# Patient Record
Sex: Male | Born: 1965 | Hispanic: Refuse to answer | Smoking: Former smoker
Health system: Southern US, Community
[De-identification: ages and names within clinical notes are randomized; demographics above are authoritative.]

## PROBLEM LIST (undated history)

## (undated) DIAGNOSIS — I10 Essential (primary) hypertension: Secondary | ICD-10-CM

## (undated) HISTORY — DX: Essential (primary) hypertension: I10

## (undated) HISTORY — PX: KNEE SURGERY: SHX244

---

## 1999-11-24 ENCOUNTER — Emergency Department (HOSPITAL_COMMUNITY): Admission: EM | Admit: 1999-11-24 | Discharge: 1999-11-24 | Payer: Self-pay | Admitting: Emergency Medicine

## 1999-11-24 ENCOUNTER — Encounter: Payer: Self-pay | Admitting: Emergency Medicine

## 2000-03-10 ENCOUNTER — Emergency Department (HOSPITAL_COMMUNITY): Admission: EM | Admit: 2000-03-10 | Discharge: 2000-03-10 | Payer: Self-pay | Admitting: Emergency Medicine

## 2000-05-12 ENCOUNTER — Encounter: Payer: Self-pay | Admitting: Emergency Medicine

## 2000-05-12 ENCOUNTER — Emergency Department (HOSPITAL_COMMUNITY): Admission: EM | Admit: 2000-05-12 | Discharge: 2000-05-12 | Payer: Self-pay | Admitting: Emergency Medicine

## 2003-05-16 ENCOUNTER — Emergency Department (HOSPITAL_COMMUNITY): Admission: EM | Admit: 2003-05-16 | Discharge: 2003-05-16 | Payer: Self-pay | Admitting: Emergency Medicine

## 2003-06-19 ENCOUNTER — Emergency Department (HOSPITAL_COMMUNITY): Admission: EM | Admit: 2003-06-19 | Discharge: 2003-06-19 | Payer: Self-pay

## 2005-07-29 ENCOUNTER — Encounter: Admission: RE | Admit: 2005-07-29 | Discharge: 2005-07-29 | Payer: Self-pay | Admitting: Occupational Medicine

## 2009-08-06 ENCOUNTER — Emergency Department (HOSPITAL_COMMUNITY): Admission: EM | Admit: 2009-08-06 | Discharge: 2009-08-06 | Payer: Self-pay | Admitting: Emergency Medicine

## 2009-08-27 ENCOUNTER — Ambulatory Visit (HOSPITAL_COMMUNITY): Admission: RE | Admit: 2009-08-27 | Discharge: 2009-08-27 | Payer: Self-pay | Admitting: Orthopedic Surgery

## 2009-09-13 ENCOUNTER — Ambulatory Visit (HOSPITAL_COMMUNITY): Admission: RE | Admit: 2009-09-13 | Discharge: 2009-09-14 | Payer: Self-pay | Admitting: Orthopedic Surgery

## 2009-11-04 ENCOUNTER — Encounter: Admission: RE | Admit: 2009-11-04 | Discharge: 2010-02-02 | Payer: Self-pay | Admitting: Orthopedic Surgery

## 2010-07-06 ENCOUNTER — Encounter: Payer: Self-pay | Admitting: Orthopedic Surgery

## 2010-09-08 LAB — APTT: aPTT: 28 seconds (ref 24–37)

## 2010-09-08 LAB — COMPREHENSIVE METABOLIC PANEL
ALT: 15 U/L (ref 0–53)
BUN: 11 mg/dL (ref 6–23)
CO2: 22 mEq/L (ref 19–32)
Chloride: 104 mEq/L (ref 96–112)
GFR calc Af Amer: >60 mL/min (ref >60)
Glucose, Bld: 90 mg/dL (ref 70–99)

## 2010-09-08 LAB — URINALYSIS, ROUTINE W REFLEX MICROSCOPIC
Bilirubin Urine: NEGATIVE
Glucose, UA: NEGATIVE mg/dL
Ketones, ur: NEGATIVE mg/dL
Specific Gravity, Urine: 1.008 (ref 1.005–1.030)
Urobilinogen, UA: 0.2 mg/dL (ref 0.0–1.0)

## 2010-09-08 LAB — CBC
Hemoglobin: 15 g/dL (ref 13.0–17.0)
Platelets: 211 10*3/uL (ref 150–400)

## 2011-11-08 IMAGING — CR DG KNEE COMPLETE 4+V*R*
4 series · 4 of 4 positions shown · non-contrast
Comparison: Femur 08/06/2009

CLINICAL DATA: Pain in the lateral side of knee.  Pain with
weightbearing.  Twisting injury.

RIGHT KNEE - COMPLETE 4+ VIEW

[t knee ap right]
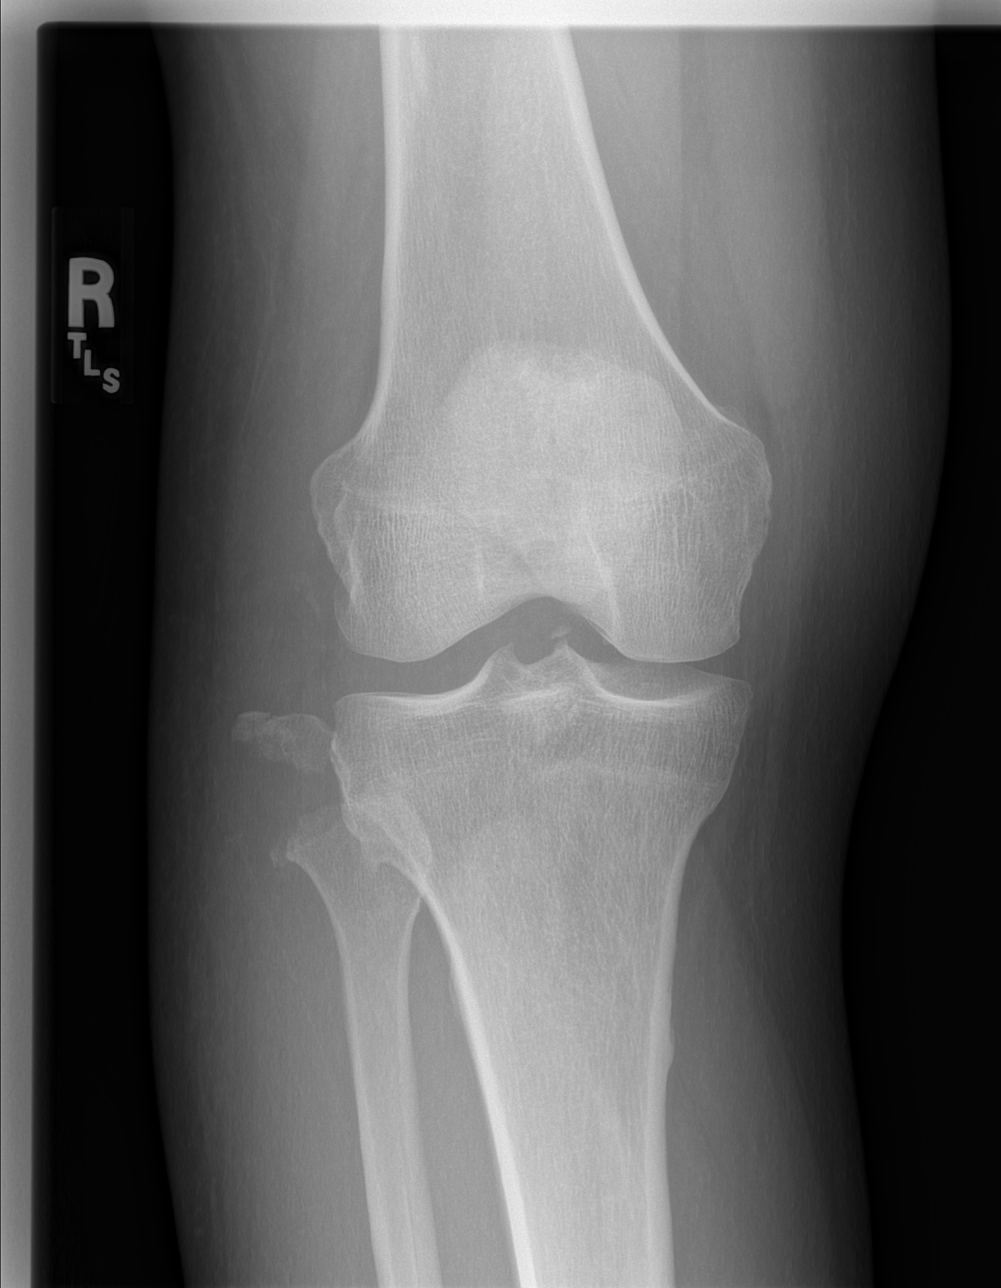

[t knee oblique right (1 of 2)]
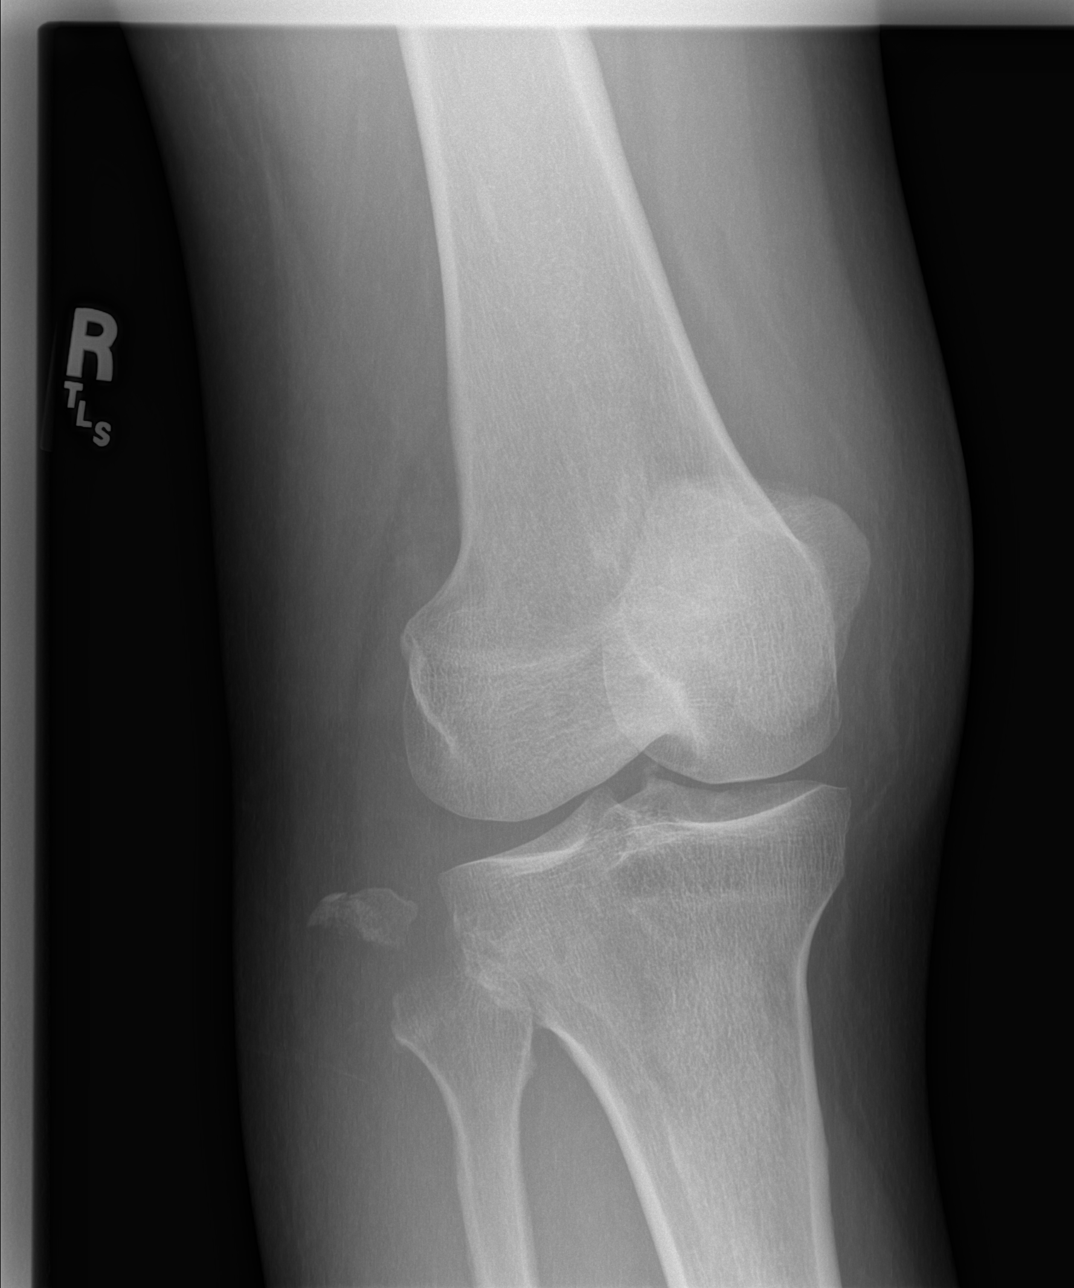

[t knee oblique right (2 of 2)]
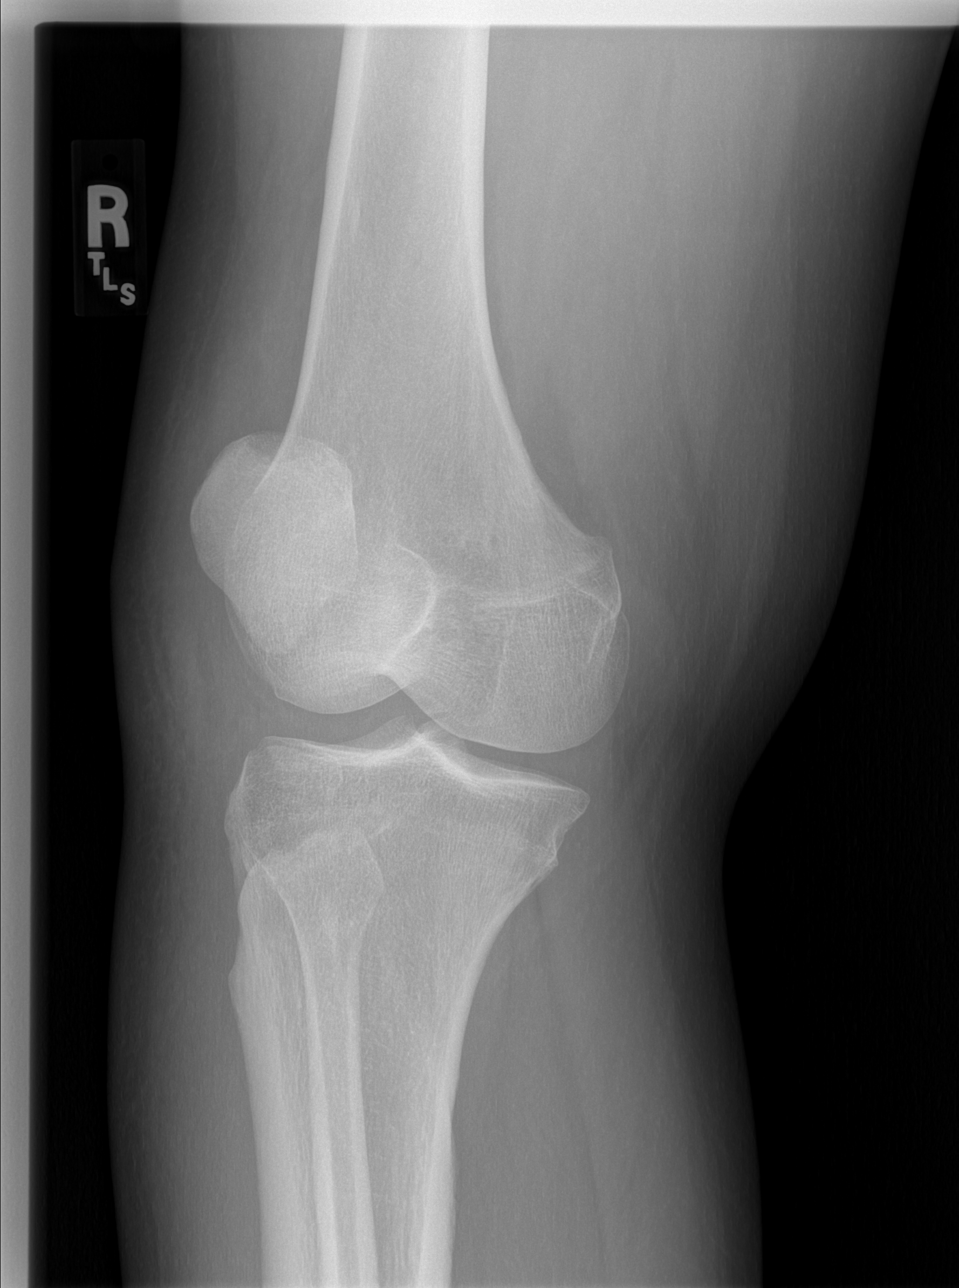

[t knee lat right]
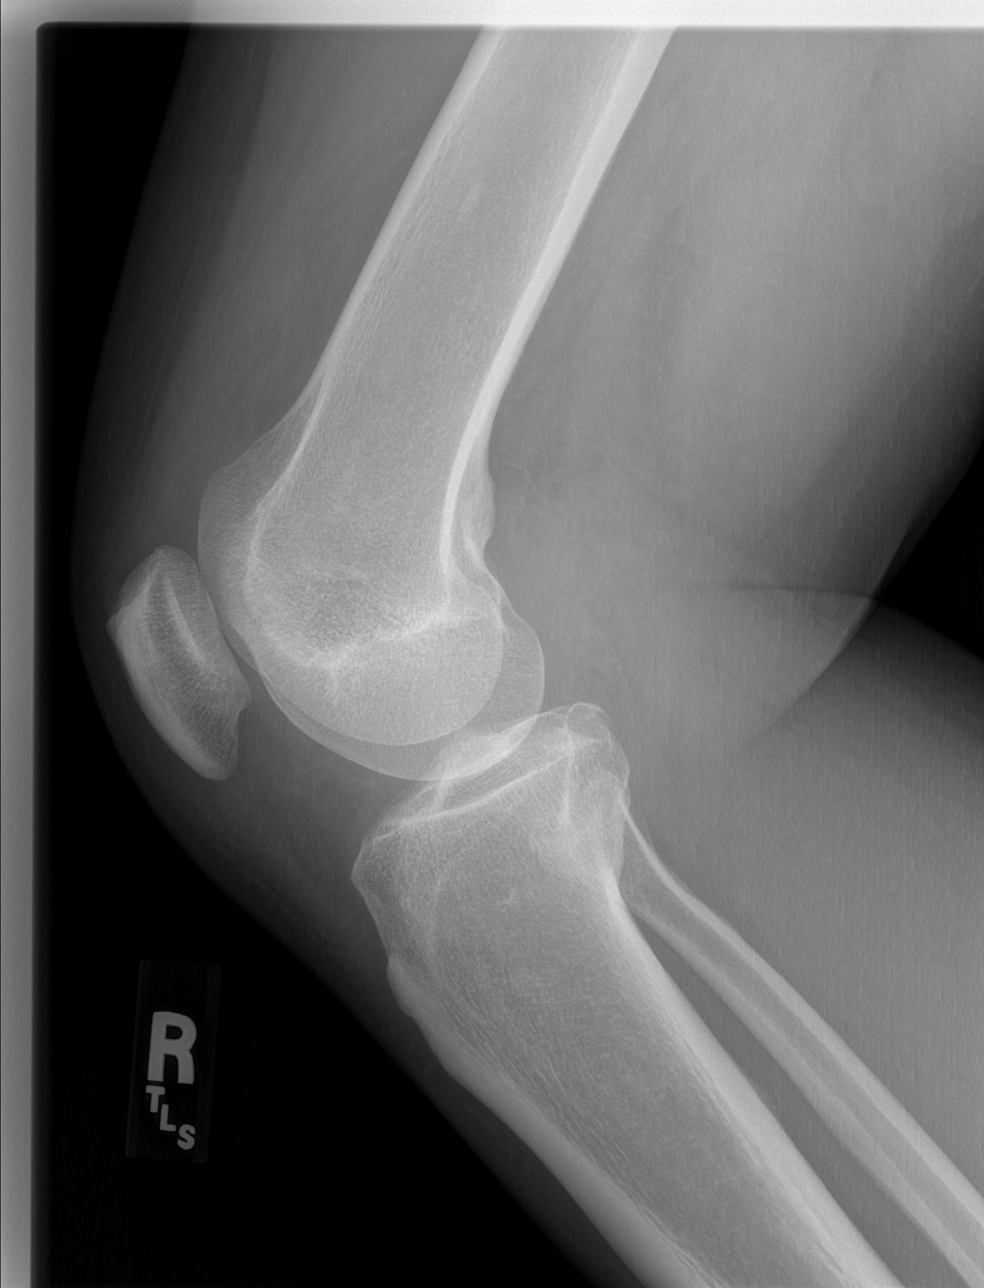

[4 of 4 positions shown; findings below may reference images not displayed]

FINDINGS: There is a joint effusion.  A bone fragment is seen
lateral to the lateral tibial plateau, consistent with a
significantly displaced fibular head fracture.  There is a small
bone density adjacent to the tibial spines, raising concern for
possible cruciate injury.  No definite tibial plateau fracture
identified.
IMPRESSION: 1.  Fibular head fracture.
2.  Concern for possible cruciate injury.  Consider MRI.

## 2016-02-17 ENCOUNTER — Encounter (HOSPITAL_COMMUNITY): Payer: Self-pay | Admitting: Emergency Medicine

## 2016-02-17 ENCOUNTER — Emergency Department (HOSPITAL_COMMUNITY)
Admission: EM | Admit: 2016-02-17 | Discharge: 2016-02-17 | Disposition: A | Discharge status: Home / Self Care | Payer: Self-pay | Attending: Emergency Medicine | Admitting: Emergency Medicine

## 2016-02-17 ENCOUNTER — Emergency Department (HOSPITAL_COMMUNITY): Payer: Self-pay

## 2016-02-17 DIAGNOSIS — Y999 Unspecified external cause status: Secondary | ICD-10-CM | POA: Insufficient documentation

## 2016-02-17 DIAGNOSIS — Y939 Activity, unspecified: Secondary | ICD-10-CM | POA: Insufficient documentation

## 2016-02-17 DIAGNOSIS — Y929 Unspecified place or not applicable: Secondary | ICD-10-CM | POA: Insufficient documentation

## 2016-02-17 DIAGNOSIS — M25561 Pain in right knee: Secondary | ICD-10-CM

## 2016-02-17 DIAGNOSIS — F17200 Nicotine dependence, unspecified, uncomplicated: Secondary | ICD-10-CM | POA: Insufficient documentation

## 2016-02-17 DIAGNOSIS — W01198A Fall on same level from slipping, tripping and stumbling with subsequent striking against other object, initial encounter: Secondary | ICD-10-CM | POA: Insufficient documentation

## 2016-02-17 MED ORDER — OXYCODONE-ACETAMINOPHEN 5-325 MG PO TABS: 1.0000 | ORAL_TABLET | Freq: Four times a day (QID) | ORAL | 0 refills | Status: DC | PRN

## 2016-02-17 MED ORDER — NAPROXEN 500 MG PO TABS: 500.0000 mg | ORAL_TABLET | Freq: Two times a day (BID) | ORAL | 0 refills | Status: DC

## 2016-02-17 MED ORDER — OXYCODONE-ACETAMINOPHEN 5-325 MG PO TABS
1.0000 | ORAL_TABLET | Freq: Once | ORAL | Status: AC
  Administered 2016-02-17: 1 via ORAL
  Filled 2016-02-17: qty 1

## 2016-02-17 NOTE — ED Triage Notes (Signed)
Pt sts right knee pain after falling 4 days ago and still having pain

## 2016-02-17 NOTE — Discharge Instructions (Signed)
Take medication as prescribed. [facility] as soon as possible. Keep you leg elevated when resting at home.

## 2016-02-17 NOTE — ED Provider Notes (Signed)
[facility]EM[facility] DEPT Provider Note   CSN: [id number] Arrival date & time: 02/17/16  1130    By signing my name below, I, Sonum Patel, attest that this documentation has been prepared under the direction and in the presence of Jayzen Paver, [place]. Electronically Signed: Sonum Patel, Neurosurgeon. 02/17/16. 12:12 PM.  History   Chief Complaint Chief Complaint  Patient presents with  . Knee Pain    The history is provided by the patient. No language interpreter was used.     HPI Comments: Jeffrey Walters is a 50 y.o. male who presents to the Emergency Department complaining of constant right knee pain that began 3 days ago after a slip and fall, striking it on the ground. Patient states he awoke this morning with worsened pain and swelling to the affected knee. He states bearing weight worsens his pain and causes his right knee to buckle. He rates his pain as a 10/10 currently. He has applied ice and heat without relief. He denies numbness, weakness.   History reviewed. No pertinent past medical history.  There are no active problems to display for this patient.   History reviewed. No pertinent surgical history.     Home Medications    Prior to Admission medications   Not on File    Family History History reviewed. No pertinent family history.  Social History Social History  Substance Use Topics  . Smoking status: Current Every Day Smoker  . Smokeless tobacco: Never Used  . Alcohol use Yes     Allergies   Review of patient's allergies indicates no known allergies.   Review of Systems Review of Systems 10 Systems reviewed and all are negative for acute change except as noted in the HPI.    Physical Exam Updated Vital Signs BP (!) 154/110 (BP Location: Right Arm)   Pulse 105   Temp 98.9 F (37.2 C) (Oral)   Resp 18   SpO2 100%   Physical Exam  Constitutional: He is oriented to person, place, and time. He appears well-developed and well-nourished.  HENT:  Head:  Normocephalic and atraumatic.  Cardiovascular: Normal rate and intact distal pulses.   Pulmonary/Chest: Effort normal.  Musculoskeletal: Normal range of motion. He exhibits edema and tenderness. He exhibits no deformity.  Right knee diffusely edematous with point tenderness along inferior joint line. Full ROM with good strength. 2+ DP and PT pulses.   Neurological: He is alert and oriented to person, place, and time.  Skin: Skin is warm and dry.  Psychiatric: He has a normal mood and affect.  Nursing note and vitals reviewed.    ED Treatments / Results  DIAGNOSTIC STUDIES: Oxygen Saturation is 100% on RA, normal by my interpretation.    COORDINATION OF CARE: 12:12 PM Discussed treatment plan with pt at bedside and pt agreed to plan.    Labs (all labs ordered are listed, but only abnormal results are displayed) Labs Reviewed - No data to display  EKG  EKG Interpretation None       Radiology No results found.  Procedures Procedures (including critical care time)  Medications Ordered in ED Medications  oxyCODONE-acetaminophen (PERCOCET/ROXICET) 5-325 MG per tablet 1 tablet (not administered)     Initial Impression / Assessment and Plan / ED Course  I have reviewed the triage vital signs and the nursing notes.  Pertinent labs & imaging results that were available during my care of the patient were reviewed by me and considered in my medical decision making (see chart for  details).  Clinical Course   3:53 PM Ortho consult order placed 2 hrs ago, call placed ~90 minutes ago. We have re-paged ortho several times, I personally paged them twice within the past 30 minutes. Pt does not want to wait any longer. He wants to go home and call to f/u in ortho clinic. Neurovascularly intact and hemodynamically stable.  As pt was receiving knee immobilizera nd preparing for discharge I was able to get in touch with Dr. Magnus IvanBlackman of ortho who agrees to immobilize and f/u in clinic.  Pt has seen Dr. Rennis ChrisSupple in the past so instructed [facility]. rx for pain meds given.  Final Clinical Impressions(s) / ED Diagnoses   Final diagnoses:  Knee pain, right    New Prescriptions Discharge Medication List as of 02/17/2016  4:19 PM    START taking these medications   Details  naproxen (NAPROSYN) 500 MG tablet Take 1 tablet (500 mg total) by mouth 2 (two) times daily., Starting Mon 02/17/2016, Print    oxyCODONE-acetaminophen (PERCOCET/ROXICET) 5-325 MG tablet Take 1-2 tablets by mouth every 6 (six) hours as needed for severe pain., Starting Mon 02/17/2016, Print      I personally performed the services described in this documentation, which was scribed in my presence. The recorded information has been reviewed and is accurate.    Carlene CoriaSerena Y Tamela Elsayed, PA-C 02/18/16 [id number]    Lorre NickAnthony Allen, MD 02/18/16 [phone]

## 2016-02-20 ENCOUNTER — Emergency Department (HOSPITAL_COMMUNITY)
Admission: EM | Admit: 2016-02-20 | Discharge: 2016-02-21 | Disposition: A | Discharge status: Home / Self Care | Payer: Worker's Compensation | Attending: Emergency Medicine | Admitting: Emergency Medicine

## 2016-02-20 ENCOUNTER — Encounter (HOSPITAL_COMMUNITY): Payer: Self-pay | Admitting: Emergency Medicine

## 2016-02-20 DIAGNOSIS — F17200 Nicotine dependence, unspecified, uncomplicated: Secondary | ICD-10-CM | POA: Insufficient documentation

## 2016-02-20 DIAGNOSIS — Z0289 Encounter for other administrative examinations: Secondary | ICD-10-CM | POA: Insufficient documentation

## 2016-02-20 NOTE — ED Triage Notes (Signed)
Pt. reports persistent right knee pain unrelieved by prescription Percocet and Naproxen , he injured his knee at work last Friday , he was seen here 3 days ago discharged home with prescription pain medications . Ambulatory using his crutches with knee immobilizer.

## 2016-02-20 NOTE — ED Notes (Signed)
Clarification with staff that pt here for a work note based on previous visit. Acuity changed.

## 2016-02-21 MED ORDER — OXYCODONE-ACETAMINOPHEN 5-325 MG PO TABS: 1.0000 | ORAL_TABLET | Freq: Four times a day (QID) | ORAL | 0 refills | Status: DC | PRN

## 2016-02-21 NOTE — Discharge Instructions (Signed)
Continue to elevate the knee, take your medications as directed and follow up with ortho as planned. Return here for worsening symptoms.

## 2016-02-21 NOTE — ED Provider Notes (Signed)
[facility] Provider Note   CSN: [id number] Arrival date & time: 02/20/16  2021     History   Chief Complaint Chief Complaint  Patient presents with  . Knee Pain    HPI Jeffrey Walters is a 50 y.o. male who presents to the ED for a work note. He reports that he was evaluated here in the ED 3 days ago and given referral to ortho. He call to set up an appointment and they could not give him one until the Workers Comp was approved. 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 He is also out of his pain medication since he could not see the doctor.   The history is provided by the patient. No language interpreter was used.    History reviewed. No pertinent past medical history.  There are no active problems to display for this patient.   Past Surgical History:  Procedure Laterality Date  . KNEE SURGERY         Home Medications    Prior to Admission medications   Medication Sig Start Date End Date Taking? Authorizing Provider  naproxen (NAPROSYN) 500 MG tablet Take 1 tablet (500 mg total) by mouth 2 (two) times daily. 02/17/16   Carlene CoriaSerena Y Sam, PA-C  oxyCODONE-acetaminophen (PERCOCET/ROXICET) 5-325 MG tablet Take 1 tablet by mouth every 6 (six) hours as needed for severe pain. 02/21/16   Maveric Debono Orlene OchM Laurisa Sahakian, NP    Family History No family history on file.  Social History Social History  Substance Use Topics  . Smoking status: Current Every Day Smoker  . Smokeless tobacco: Never Used  . Alcohol use Yes     Allergies   Penicillins   Review of Systems Review of Systems Negative except as stated in HPI  Physical Exam Updated Vital Signs BP 124/99 (BP Location: Right Arm)   Pulse 76   Temp 98.2 F (36.8 C) (Oral)   Resp 20   SpO2 98%   Physical Exam  Constitutional: He is oriented to person, place, and time. He  appears well-developed and well-nourished.  HENT:  Head: Normocephalic.  Eyes: EOM are normal.  Neck: Neck supple.  Cardiovascular: Normal rate.   Pulmonary/Chest: Effort normal.  Abdominal: Soft. There is no tenderness.  Musculoskeletal:  Knee immobilizer in place and patient using crutches for ambulation. Pedal pulses 2+.  Neurological: He is alert and oriented to person, place, and time. No cranial nerve deficit.  Skin: Skin is warm and dry.  Psychiatric: He has a normal mood and affect. His behavior is normal.  Nursing note and vitals reviewed.    ED Treatments / Results  Labs (all labs ordered are listed, but only abnormal results are displayed) Labs Reviewed - No data to display  Radiology No results found.  Procedures Procedures (including critical care time)  Medications Ordered in ED Medications - No data to display   Initial Impression / Assessment and Plan / ED Course  I have reviewed the triage vital signs and the nursing notes.   Clinical Course   Work excuse for patient and pain medication.  Final Clinical Impressions(s) / ED Diagnoses   Final diagnoses:  Encounter to obtain excuse from work    New Prescriptions Discharge Medication List as of 02/21/2016 12:16 AM       [facility]  Orlene Och, NP 02/21/16 1610    Shaune Pollack, MD 02/21/16 1344

## 2018-08-09 ENCOUNTER — Emergency Department (HOSPITAL_COMMUNITY)
Admission: EM | Admit: 2018-08-09 | Discharge: 2018-08-10 | Disposition: A | Discharge status: Home / Self Care | Payer: Self-pay

## 2020-01-04 ENCOUNTER — Other Ambulatory Visit: Payer: Self-pay | Admitting: Family Medicine

## 2020-01-04 ENCOUNTER — Ambulatory Visit
Admission: RE | Admit: 2020-01-04 | Discharge: 2020-01-04 | Disposition: A | Discharge status: Home / Self Care | Payer: Worker's Compensation | Source: Ambulatory Visit | Attending: Family Medicine | Admitting: Family Medicine

## 2020-01-04 DIAGNOSIS — S9782XA Crushing injury of left foot, initial encounter: Secondary | ICD-10-CM

## 2021-10-08 ENCOUNTER — Encounter (HOSPITAL_COMMUNITY): Payer: Self-pay | Admitting: Emergency Medicine

## 2021-10-08 ENCOUNTER — Ambulatory Visit (HOSPITAL_COMMUNITY)
Admission: EM | Admit: 2021-10-08 | Discharge: 2021-10-08 | Disposition: A | Discharge status: Home / Self Care | Payer: Self-pay | Attending: Emergency Medicine | Admitting: Emergency Medicine

## 2021-10-08 DIAGNOSIS — J029 Acute pharyngitis, unspecified: Secondary | ICD-10-CM

## 2021-10-08 DIAGNOSIS — I10 Essential (primary) hypertension: Secondary | ICD-10-CM

## 2021-10-08 LAB — POCT RAPID STREP A, ED / UC: Streptococcus, Group A Screen (Direct): NEGATIVE

## 2021-10-08 MED ORDER — PREDNISONE 50 MG PO TABS: ORAL_TABLET | ORAL | 0 refills | Status: DC

## 2021-10-08 MED ORDER — AZITHROMYCIN 250 MG PO TABS: 250.0000 mg | ORAL_TABLET | Freq: Every day | ORAL | 0 refills | Status: DC

## 2021-10-08 MED ORDER — LISINOPRIL 5 MG PO TABS: 5.0000 mg | ORAL_TABLET | Freq: Every day | ORAL | 0 refills | Status: DC

## 2021-10-08 NOTE — Discharge Instructions (Addendum)
Your strep screen was negative today however I am suspicious for bacterial source.  A culture has been sent and is pending you will be called with the results when received in the meantime take antibiotic as directed along with steroid for swelling.  Your blood pressure was also very high today had like you to start logging this at home and start new blood pressure medication.  You will need to be followed up in the next couple of weeks bring in a blood pressure log either here or if you can get into a primary care provider for blood pressure recheck and possible follow-up lab work ?

## 2021-10-08 NOTE — ED Provider Notes (Addendum)
?[name] ? ? ?MRN: [id number] DOB: Feb 23, 1966 ? ?Subjective:  ? ?Chief Complaint; Pt reports that for 3 days having sore throat and severe pain with swallowing. ?Chief Complaint  ?Patient presents with  ? Sore Throat  ? ? ?Jeffrey Walters is a 56 y.o. male presenting for sore throat for the last 3 days.  He denies fevers, nausea or vomiting.  It is also noted his blood pressure is elevated today, he denies knowing any family history of hypertension and denies headaches visual changes ? ?No current facility-administered medications for this encounter. ? ?Current Outpatient Medications:  ?  azithromycin (ZITHROMAX) 250 MG tablet, Take 1 tablet (250 mg total) by mouth daily. Take first 2 tablets together, then 1 every day until finished., Disp: 6 tablet, Rfl: 0 ?  lisinopril (ZESTRIL) 5 MG tablet, Take 1 tablet (5 mg total) by mouth daily., Disp: 30 tablet, Rfl: 0 ?  predniSONE (DELTASONE) 50 MG tablet, Take 1 pill daily for 5 days as directed, Disp: 5 tablet, Rfl: 0 ?  naproxen (NAPROSYN) 500 MG tablet, Take 1 tablet (500 mg total) by mouth 2 (two) times daily., Disp: 30 tablet, Rfl: 0 ?  oxyCODONE-acetaminophen (PERCOCET/ROXICET) 5-325 MG tablet, Take 1 tablet by mouth every 6 (six) hours as needed for severe pain., Disp: 15 tablet, Rfl: 0  ? ?Allergies  ?Allergen Reactions  ? Penicillins   ? ? ?History reviewed. No pertinent past medical history.  ? ?Review of Systems  ?All other systems reviewed and are negative. ? ? ?Objective:  ? ?Vitals: ?BP (!) 163/116 (BP Location: Right Arm)   Pulse 77   Temp 98.4 ?F (36.9 ?C) (Oral)   Resp 18   SpO2 98%  ? ?Physical Exam ?Vitals and nursing note reviewed.  ?Constitutional:   ?   General: He is not in acute distress. ?   Appearance: He is well-developed.  ?HENT:  ?   Head: Normocephalic and atraumatic.  ?   Right Ear: Tympanic membrane normal.  ?   Left Ear: Tympanic membrane normal.  ?   Nose: No congestion.  ?   Mouth/Throat:  ?   Mouth: No oral  lesions.  ?   Pharynx: Pharyngeal swelling, posterior oropharyngeal erythema and uvula swelling present. No oropharyngeal exudate.  ?Eyes:  ?   Conjunctiva/sclera: Conjunctivae normal.  ?Cardiovascular:  ?   Rate and Rhythm: Normal rate and regular rhythm.  ?   Heart sounds: No murmur heard. ?Pulmonary:  ?   Effort: Pulmonary effort is normal. No respiratory distress.  ?   Breath sounds: Normal breath sounds.  ?Abdominal:  ?   Palpations: Abdomen is soft.  ?   Tenderness: There is no abdominal tenderness.  ?Musculoskeletal:     ?   General: No swelling.  ?   Cervical back: Neck supple.  ?Skin: ?   General: Skin is warm and dry.  ?   Capillary Refill: Capillary refill takes less than 2 seconds.  ?Neurological:  ?   Mental Status: He is alert.  ?Psychiatric:     ?   Mood and Affect: Mood normal.  ? ? ?Results for orders placed or performed during the hospital encounter of 10/08/21 (from the past 24 hour(s))  ?POCT Rapid Strep A     Status: None  ? Collection Time: 10/08/21  6:34 PM  ?Result Value Ref Range  ? Streptococcus, Group A Screen (Direct) NEGATIVE NEGATIVE  ? ? ?No results found.  ?  ? ?Assessment and  Plan :  ? ?1. Sore throat   ?2. Hypertension, unspecified type   ? ? ?Meds ordered this encounter  ?Medications  ? azithromycin (ZITHROMAX) 250 MG tablet  ?  Sig: Take 1 tablet (250 mg total) by mouth daily. Take first 2 tablets together, then 1 every day until finished.  ?  Dispense:  6 tablet  ?  Refill:  0  ? lisinopril (ZESTRIL) 5 MG tablet  ?  Sig: Take 1 tablet (5 mg total) by mouth daily.  ?  Dispense:  30 tablet  ?  Refill:  0  ? predniSONE (DELTASONE) 50 MG tablet  ?  Sig: Take 1 pill daily for 5 days as directed  ?  Dispense:  5 tablet  ?  Refill:  0  ? ? ?MDM:  ?Jeffrey Walters is a 56 y.o. male presenting for sore throat for the last 3 days that he describes as excruciating.  His clinical exam reveals erythematous irritated posterior pharynx and posterior soft palate without exudate.  He does have  gross swelling there is no stridor.  Also noted is a very elevated blood pressure which repeated did lower 163/116.  I advised patient to start on blood pressure lowering medication and start taking his blood pressures at home he will also start a blood pressure log and bring this into a follow-up appointment with PCP in 2 to 3 weeks he will be called with the throat culture as the rapid strep was negative.  I started antibiotics despite the negative strep due to the severity of the erythematous and clinical presentation which was more suggestive of a bacterial infection.  I discussed treatment, follow up and return instructions. Questions were answered. Patient/representative stated understanding of instructions and patient is stable for discharge. ? ?Dewaine Conger FNP-C MCN  ?  ?Jone Baseman, NP ?10/08/21 1843 ? ?  ?Jone Baseman, NP ?10/08/21 1847 ? ?

## 2021-10-08 NOTE — ED Triage Notes (Signed)
Pt reports that for 3 days having sore throat and severe pain with swallowing.  ?

## 2021-10-11 LAB — CULTURE, GROUP A STREP (THRC)

## 2022-03-25 ENCOUNTER — Encounter (HOSPITAL_BASED_OUTPATIENT_CLINIC_OR_DEPARTMENT_OTHER): Payer: Self-pay | Admitting: Emergency Medicine

## 2022-03-25 ENCOUNTER — Emergency Department (HOSPITAL_BASED_OUTPATIENT_CLINIC_OR_DEPARTMENT_OTHER)
Admission: EM | Admit: 2022-03-25 | Discharge: 2022-03-25 | Disposition: A | Discharge status: Home / Self Care | Payer: BC Managed Care – PPO | Attending: Emergency Medicine | Admitting: Emergency Medicine

## 2022-03-25 ENCOUNTER — Other Ambulatory Visit: Payer: Self-pay

## 2022-03-25 DIAGNOSIS — R220 Localized swelling, mass and lump, head: Secondary | ICD-10-CM | POA: Diagnosis not present

## 2022-03-25 DIAGNOSIS — T783XXA Angioneurotic edema, initial encounter: Secondary | ICD-10-CM | POA: Diagnosis not present

## 2022-03-25 LAB — COMPREHENSIVE METABOLIC PANEL
ALT: 29 U/L (ref 0–44)
AST: 46 U/L — ABNORMAL HIGH (ref 15–41)
Albumin: 4.8 g/dL (ref 3.5–5.0)
Alkaline Phosphatase: 60 U/L (ref 38–126)
Anion gap: 11 (ref 5–15)
BUN: 15 mg/dL (ref 6–20)
CO2: 24 mmol/L (ref 22–32)
Calcium: 10 mg/dL (ref 8.9–10.3)
Chloride: 99 mmol/L (ref 98–111)
Creatinine, Ser: 1.02 mg/dL (ref 0.61–1.24)
GFR, Estimated: >60 mL/min (ref >60)
Glucose, Bld: 114 mg/dL — ABNORMAL HIGH (ref 70–99)
Potassium: 4 mmol/L (ref 3.5–5.1)
Sodium: 134 mmol/L — ABNORMAL LOW (ref 135–145)
Total Bilirubin: 1.6 mg/dL — ABNORMAL HIGH (ref 0.3–1.2)
Total Protein: 8.3 g/dL — ABNORMAL HIGH (ref 6.5–8.1)

## 2022-03-25 LAB — CBC WITH DIFFERENTIAL/PLATELET
Abs Immature Granulocytes: 0.02 10*3/uL (ref 0.00–0.07)
Basophils Absolute: 0 10*3/uL (ref 0.0–0.1)
Basophils Relative: 0 %
Eosinophils Absolute: 0 10*3/uL (ref 0.0–0.5)
Eosinophils Relative: 0 %
HCT: 43.7 % (ref 39.0–52.0)
Hemoglobin: 15.6 g/dL (ref 13.0–17.0)
Immature Granulocytes: 0 %
Lymphocytes Relative: 16 %
Lymphs Abs: 1.3 10*3/uL (ref 0.7–4.0)
MCH: 31.6 pg (ref 26.0–34.0)
MCHC: 35.7 g/dL (ref 30.0–36.0)
MCV: 88.6 fL (ref 80.0–100.0)
Monocytes Absolute: 0.2 10*3/uL (ref 0.1–1.0)
Monocytes Relative: 2 %
Neutro Abs: 6.6 10*3/uL (ref 1.7–7.7)
Neutrophils Relative %: 82 %
Platelets: 221 10*3/uL (ref 150–400)
RBC: 4.93 MIL/uL (ref 4.22–5.81)
RDW: 12.9 % (ref 11.5–15.5)
WBC: 8.2 10*3/uL (ref 4.0–10.5)
nRBC: 0 % (ref 0.0–0.2)

## 2022-03-25 MED ORDER — FAMOTIDINE 20 MG PO TABS
40.0000 mg | ORAL_TABLET | Freq: Once | ORAL | Status: AC
  Administered 2022-03-25: 40 mg via ORAL
  Filled 2022-03-25: qty 2

## 2022-03-25 MED ORDER — METHYLPREDNISOLONE SODIUM SUCC 125 MG IJ SOLR
125.0000 mg | Freq: Once | INTRAMUSCULAR | Status: AC
  Administered 2022-03-25: 125 mg via INTRAVENOUS
  Filled 2022-03-25: qty 2

## 2022-03-25 MED ORDER — PREDNISONE 20 MG PO TABS: ORAL_TABLET | ORAL | 0 refills | Status: DC

## 2022-03-25 MED ORDER — DIPHENHYDRAMINE HCL 25 MG PO TABS: 25.0000 mg | ORAL_TABLET | Freq: Four times a day (QID) | ORAL | 0 refills | Status: DC | PRN

## 2022-03-25 MED ORDER — EPINEPHRINE 0.3 MG/0.3ML IJ SOAJ
0.3000 mg | Freq: Once | INTRAMUSCULAR | Status: AC
  Administered 2022-03-25: 0.3 mg via INTRAMUSCULAR
  Filled 2022-03-25: qty 0.3

## 2022-03-25 MED ORDER — FAMOTIDINE 20 MG PO TABS: 20.0000 mg | ORAL_TABLET | Freq: Two times a day (BID) | ORAL | 0 refills | Status: DC

## 2022-03-25 MED ORDER — DIPHENHYDRAMINE HCL 50 MG/ML IJ SOLN
25.0000 mg | Freq: Once | INTRAMUSCULAR | Status: AC
  Administered 2022-03-25: 25 mg via INTRAVENOUS
  Filled 2022-03-25: qty 1

## 2022-03-25 MED ORDER — TRANEXAMIC ACID-NACL 1000-0.7 MG/100ML-% IV SOLN
1000.0000 mg | INTRAVENOUS | Status: AC
  Administered 2022-03-25: 1000 mg via INTRAVENOUS
  Filled 2022-03-25: qty 100

## 2022-03-25 MED ORDER — SODIUM CHLORIDE 0.9 % IV SOLN: Freq: Once | INTRAVENOUS | Status: AC

## 2022-03-25 NOTE — ED Notes (Signed)
Facial swelling has advanced to the right side of face. Pt is stable at 100% room air.

## 2022-03-25 NOTE — ED Notes (Signed)
Pt.s IV wrapped and secured for POV transport to [facility]. Instructed pt to got to [facility] ED.

## 2022-03-25 NOTE — Discharge Instructions (Signed)
1.  You may not take any pressure medications that are considered ACE inhibitors.  This is a category of blood pressure medications that can have a side effect of severe swelling.  Since you have had this effect once, you may never take this kind of medication again.  Had severe swelling of the lips but sometimes people can experience severe swelling of the throat or tongue that can block their breathing. 2.  You still have some swelling of your upper lip but no difficulty breathing.  You can continue to take prednisone, Benadryl and Pepcid as prescribed.  These medications may be helpful, but are not guaranteed to prevent worsening swelling.  If at anytime you feel that you are getting increasing swelling, especially ANY tongue or throat, return to the emergency department immediately. 3.  Follow-up with your doctor for recheck as soon as possible.  You will need to monitor your blood pressures and may need to start another blood pressure medication.  It is very important that hypertension is treated.  If your blood pressure is elevating, you will need to be started on a different class of antihypertensive medication.

## 2022-03-25 NOTE — ED Provider Notes (Addendum)
Patient is transferred from [place] for continued observation and treatment for angioedema.  Patient reports that it started at about 10:30 AM at work this morning.  Takes lisinopril which she had taken a dose at about 1:30 AM.  He reports that he has been taking it on and off for the past several months for blood pressure control.  Never had a previous reaction.  Patient denies throughout all of this he has had any difficulty swallowing or breathing.  He reports the upper lip is starting to soften although it is still very swollen. Physical Exam  BP (!) 154/114 (BP Location: Left Arm)   Pulse 92   Temp 98.8 F (37.1 C)   Resp 18   SpO2 100%   Physical Exam Constitutional:      Comments: Alert nontoxic clinically well in appearance.  Sitting in a chair.  HENT:     Mouth/Throat:     Comments: Extreme swelling of the upper lip and face.  Oral cavity has no intraoral swelling.  Posterior airway is widely patent.  The tongue is not swollen.  See attached images Eyes:     Extraocular Movements: Extraocular movements intact.  Cardiovascular:     Rate and Rhythm: Normal rate and regular rhythm.  Pulmonary:     Effort: Pulmonary effort is normal.     Breath sounds: Normal breath sounds.  Musculoskeletal:        General: Normal range of motion.  Skin:    General: Skin is warm and dry.  Neurological:     General: No focal deficit present.  Psychiatric:        Mood and Affect: Mood normal.    Photos at [facility] 18:30    Procedures  Procedures  ED Course / MDM    Medical Decision Making Amount and/or Complexity of Data Reviewed Labs: ordered.  Risk OTC drugs. Prescription drug management.   Had very large angioedema of upper lip and face.  Is been no intraoral swelling.  Patient was treated first with Benadryl and Solu-Medrol at urgent care then TXA and epinephrine and Pepcid at [place].  Although the lip remains very swollen, the patient reports he is can subjectively  tell that the swelling is going down.  He reports that the lip does not feel as taut and hard as it did previously.  At this time with no intraoral swelling or airway compromise, I will administer another dose of Solu-Medrol and Benadryl as has been greater than 7 hours since first dose.  I will continue to observe to make sure there is no advancement of any intraoral swelling and if patient continues to have decreased swelling will consider possible discharge.  22: 35 recheck.  Patient is resting comfortably.  He has had no intraoral swelling and no subjective difficulty swallowing or breathing.  He continues to feel that the upper lip is decreasing in swelling.  At this time with no airway impingement and gradual resolution, will discharge.  I have advised the patient he must return immediately if there is any increase in swelling or any swelling whatsoever of his tongue or throat.  Patient is counseled on the necessity to have avoid any medication that is ACE inhibitor but he is counseled that he may need other antihypertension control.  He is to follow-up with PCP.       Charlesetta Shanks, MD 03/25/22 Karin Golden    Charlesetta Shanks, MD 03/25/22 2235

## 2022-03-25 NOTE — ED Provider Notes (Signed)
[facility] EMERGENCY DEPT Provider Note   CSN: [id number] Arrival date & time: 03/25/22  1123     History  Chief Complaint  Patient presents with   Facial Swelling    Jeffrey Walters is a 56 y.o. male.  Pt complains of swelling to his lop.  Pt reports he noticed this am.  Pt went to urgent care and was given Solumedrol and benadryl IM.  Patient reports he takes lisinopril for his blood pressure.  Patient reports starting earlier this year.  Patient has not had any problems with swelling in the past.  he did take the lisinopril this a.m. patient denies any other new foods he denies any new exposures.  Patient reports he awoke with a small amount of swelling this a.m. which has increased.  Patient denies any trouble breathing he denies any throat swelling he denies any difficulty swallowing  The history is provided by the patient. No language interpreter was used.       Home Medications Prior to Admission medications   Medication Sig Start Date End Date Taking? Authorizing Provider  azithromycin (ZITHROMAX) 250 MG tablet Take 1 tablet (250 mg total) by mouth daily. Take first 2 tablets together, then 1 every day until finished. 10/08/21   Jone Baseman, NP  lisinopril (ZESTRIL) 5 MG tablet Take 1 tablet (5 mg total) by mouth daily. 10/08/21 11/07/21  Jone Baseman, NP  naproxen (NAPROSYN) 500 MG tablet Take 1 tablet (500 mg total) by mouth 2 (two) times daily. 02/17/16   Eliseo Squires, PA-C  oxyCODONE-acetaminophen (PERCOCET/ROXICET) 5-325 MG tablet Take 1 tablet by mouth every 6 (six) hours as needed for severe pain. 02/21/16   Janne Napoleon, NP  predniSONE (DELTASONE) 50 MG tablet Take 1 pill daily for 5 days as directed 10/08/21   Jone Baseman, NP      Allergies    Penicillins    Review of Systems   Review of Systems  All other systems reviewed and are negative.   Physical Exam Updated Vital Signs BP (!) 152/110   Pulse 74   Temp 98.8 F (37.1 C)    Resp 18   SpO2 98%  Physical Exam Vitals and nursing note reviewed.  Constitutional:      Appearance: He is well-developed.  HENT:     Head: Normocephalic.     Nose: Nose normal.     Mouth/Throat:     Mouth: Mucous membranes are moist.     Comments: Swelling left upper lip, throat is clear, no palate edema Eyes:     Extraocular Movements: Extraocular movements intact.     Pupils: Pupils are equal, round, and reactive to light.  Cardiovascular:     Rate and Rhythm: Normal rate.  Pulmonary:     Effort: Pulmonary effort is normal.  Abdominal:     General: There is no distension.  Musculoskeletal:        General: Normal range of motion.     Cervical back: Normal range of motion.  Skin:    General: Skin is warm.  Neurological:     Mental Status: He is alert and oriented to person, place, and time.     ED Results / Procedures / Treatments   Labs (all labs ordered are listed, but only abnormal results are displayed) Labs Reviewed  COMPREHENSIVE METABOLIC PANEL - Abnormal; Notable for the following components:      Result Value   Sodium 134 (*)    Glucose, Bld  114 (*)    Total Protein 8.3 (*)    AST 46 (*)    Total Bilirubin 1.6 (*)    All other components within normal limits  CBC WITH DIFFERENTIAL/PLATELET    EKG None  Radiology No results found.  Procedures Procedures    Medications Ordered in ED Medications  famotidine (PEPCID) tablet 40 mg (40 mg Oral Given 03/25/22 1333)  0.9 %  sodium chloride infusion ( Intravenous New Bag/Given 03/25/22 1552)  tranexamic acid (CYKLOKAPRON) IVPB 1,000 mg (1,000 mg Intravenous New Bag/Given 03/25/22 1552)  EPINEPHrine (EPI-PEN) injection 0.3 mg (0.3 mg Intramuscular Given 03/25/22 1553)    ED Course/ Medical Decision Making/ A&P                           Medical Decision Making Patient has been given Solu-Medrol and Benadryl at urgent care before he came in due to lip swelling  Amount and/or Complexity of Data  Reviewed Independent Historian: parent Labs: ordered. Discussion of management or test interpretation with external provider(s): I discussed patient with Dr. Vanita Panda at [facility] emergency department he will be transferred to [facility] where he can receive FFP  Risk Prescription drug management. Risk Details: Patient has had increased swelling while in the emergency department.  He has had Benadryl Solu-Medrol and Pepcid with no relief.  Dr. Kathrynn Humble in to see and examine patient.  He advised TXA and epinephrine.  Patient had continued swelling.  Dr. Kathrynn Humble advised patient might benefit from FFP.  Patient is to go to [facility] for further treatment.  Patient may require admission if no improvement.              Final Clinical Impression(s) / ED Diagnoses Final diagnoses:  Angioedema, initial encounter    Rx / DC Orders ED Discharge Orders     None         Fransico Meadow, [place] 03/25/22 [place], MD 04/02/22 [phone]

## 2022-03-25 NOTE — ED Triage Notes (Signed)
About an hour ago left lip started swelling has progressed to cheek.

## 2022-04-22 ENCOUNTER — Ambulatory Visit (INDEPENDENT_AMBULATORY_CARE_PROVIDER_SITE_OTHER): Payer: BC Managed Care – PPO | Admitting: Nurse Practitioner

## 2022-04-22 ENCOUNTER — Encounter: Payer: Self-pay | Admitting: Nurse Practitioner

## 2022-04-22 VITALS — BP 160/110 | HR 88 | Temp 97.6°F | Ht 72.0 in | Wt 248.0 lb

## 2022-04-22 DIAGNOSIS — Z1159 Encounter for screening for other viral diseases: Secondary | ICD-10-CM

## 2022-04-22 DIAGNOSIS — Z1211 Encounter for screening for malignant neoplasm of colon: Secondary | ICD-10-CM

## 2022-04-22 DIAGNOSIS — I10 Essential (primary) hypertension: Secondary | ICD-10-CM | POA: Insufficient documentation

## 2022-04-22 DIAGNOSIS — Z1212 Encounter for screening for malignant neoplasm of rectum: Secondary | ICD-10-CM

## 2022-04-22 DIAGNOSIS — R61 Generalized hyperhidrosis: Secondary | ICD-10-CM | POA: Diagnosis not present

## 2022-04-22 DIAGNOSIS — Z114 Encounter for screening for human immunodeficiency virus [HIV]: Secondary | ICD-10-CM

## 2022-04-22 DIAGNOSIS — R350 Frequency of micturition: Secondary | ICD-10-CM

## 2022-04-22 DIAGNOSIS — E669 Obesity, unspecified: Secondary | ICD-10-CM

## 2022-04-22 MED ORDER — AMLODIPINE BESYLATE 5 MG PO TABS: 5.0000 mg | ORAL_TABLET | Freq: Every day | ORAL | 1 refills | Status: DC

## 2022-04-22 NOTE — Patient Instructions (Signed)
It was great to see you!  Start amlodipine 1 tablet daily for your blood pressure. Start checking your blood pressure at home and writing it down. Limit the salt in your diet.   We are checking your labs today and will let you know the results via mychart/phone.   We have ordered the cologuard kit to screen for colon cancer. This will be mailed to your house.   Start putting some warm compresses on the area behind your right ear. If it is still there next visit, we will order an ultrasound to follow-up on this.   Let's follow-up in 2-3 weeks, sooner if you have concerns.  If a referral was placed today, you will be contacted for an appointment. Please note that routine referrals can sometimes take up to 3-4 weeks to process. Please call our office if you haven't heard anything after this time frame.  Take care,  Vance Peper, NP

## 2022-04-22 NOTE — Assessment & Plan Note (Signed)
Chronic, not controlled.  BP today 160/110.  He was started on lisinopril for his blood pressure, however developed angioedema.  We will have him start on amlodipine 5 mg daily.  Discussed possible side effects.  Encouraged him to get a blood pressure machine and start checking his blood pressure at home.  Discussed nutrition and exercise.  Follow-up in 2 to 3 weeks.

## 2022-04-22 NOTE — Assessment & Plan Note (Signed)
BMI 33.6.  Discussed nutrition, exercise.

## 2022-04-22 NOTE — Progress Notes (Signed)
New Patient Visit  BP (!) 160/110 (BP Location: Right Arm, Cuff Size: Large)   Pulse 88   Temp 97.6 F (36.4 C) (Temporal)   Ht 6' (1.829 m)   Wt 248 lb (112.5 kg)   SpO2 99%   BMI 33.63 kg/m    Subjective:    Patient ID: Jeffrey Walters, male    DOB: [birth year], 56 y.o.   MRN: [id number]  CC: Chief Complaint  Patient presents with   Establish Care    Np. Est care. Overall health assessment. Pt concern about elevated BP and excessive night sweats for several weeks    HPI: Jeffrey Walters is a 56 y.o. male presents for new patient visit to establish care.  Introduced to Publishing rights manager role and practice setting.  All questions answered.  Discussed provider/patient relationship and expectations.  Jeffrey Walters states that he has not been to a primary care provider in 40 years.  He has a recent history of hypertension.  He was started on lisinopril and developed angioedema and went to the emergency room.  He was treated for this and his symptoms resolved, however he was told to follow-up with the PCP to restart blood pressure medication.  He has not been checking his blood pressure at home.  He denies chest pain, shortness of breath, headaches.  He has noticed a bump behind his right ear.  It started as pain 2 weeks ago.  Now the pain has resolved, however the bump is still there.  He denies URI symptoms. He has been having night sweats for about 2 weeks. He denies fevers and shortness of breath.  He denies sick contacts, travel, weight loss recently.    Depression and anxiety screen done:     04/22/2022    4:30 PM  Depression screen PHQ 2/9  Decreased Interest 2  Down, Depressed, Hopeless 0  PHQ - 2 Score 2  Altered sleeping 3  Tired, decreased energy 2  Change in appetite 0  Feeling bad or failure about yourself  1  Trouble concentrating 0  Moving slowly or fidgety/restless 0  Suicidal thoughts 0  PHQ-9 Score 8  Difficult doing work/chores Somewhat difficult       04/22/2022    4:30 PM  GAD 7 : Generalized Anxiety Score  Nervous, Anxious, on Edge 0  Control/stop worrying 0  Worry too much - different things 0  Trouble relaxing 2  Restless 0  Easily annoyed or irritable 2  Afraid - awful might happen 1  Total GAD 7 Score 5  Anxiety Difficulty Somewhat difficult    Past Medical History:  Diagnosis Date   Hypertension     Past Surgical History:  Procedure Laterality Date   KNEE SURGERY      Family History  Problem Relation Age of Onset   Cancer Mother        breast   Cancer Sister        breast     Social History   Tobacco Use   Smoking status: Former    Packs/day: 1.50    Types: Cigarettes    Quit date: 2003    Years since quitting: 20.8   Smokeless tobacco: Never  Vaping Use   Vaping Use: Never used  Substance Use Topics   Alcohol use: Yes    Comment: socially   Drug use: No    No current outpatient medications on file prior to visit.   No current facility-administered medications on file prior  to visit.     Review of Systems  Constitutional:  Positive for diaphoresis and fatigue. Negative for fever.  HENT: Negative.    Respiratory: Negative.    Cardiovascular: Negative.   Gastrointestinal: Negative.   Genitourinary:  Positive for frequency. Negative for dysuria.  Musculoskeletal:  Positive for arthralgias (right knee).  Skin:        Bump behind right ear  Neurological: Negative.   Psychiatric/Behavioral:  Positive for sleep disturbance. Negative for dysphoric mood. The patient is not nervous/anxious.       Objective:    BP (!) 160/110 (BP Location: Right Arm, Cuff Size: Large)   Pulse 88   Temp 97.6 F (36.4 C) (Temporal)   Ht 6' (1.829 m)   Wt 248 lb (112.5 kg)   SpO2 99%   BMI 33.63 kg/m   Wt Readings from Last 3 Encounters:  04/22/22 248 lb (112.5 kg)    BP Readings from Last 3 Encounters:  04/22/22 (!) 160/110  03/25/22 (!) 134/91  10/08/21 (!) 163/116    Physical Exam Vitals  and nursing note reviewed.  Constitutional:      Appearance: Normal appearance.  HENT:     Head: Normocephalic.     Right Ear: Tympanic membrane, ear canal and external ear normal.     Left Ear: Tympanic membrane, ear canal and external ear normal.  Eyes:     Conjunctiva/sclera: Conjunctivae normal.  Cardiovascular:     Rate and Rhythm: Normal rate and regular rhythm.     Pulses: Normal pulses.     Heart sounds: Normal heart sounds.  Pulmonary:     Effort: Pulmonary effort is normal.     Breath sounds: Normal breath sounds.  Abdominal:     Palpations: Abdomen is soft.     Tenderness: There is no abdominal tenderness.  Musculoskeletal:        General: Swelling (right knee) present.     Cervical back: Normal range of motion and neck supple. No tenderness.  Lymphadenopathy:     Cervical: No cervical adenopathy.  Skin:    General: Skin is warm.     Comments: Hard, swollen area behind right ear.  Neurological:     General: No focal deficit present.     Mental Status: He is alert and oriented to person, place, and time.  Psychiatric:        Mood and Affect: Mood normal.        Behavior: Behavior normal.        Thought Content: Thought content normal.        Judgment: Judgment normal.        Assessment & Plan:   Problem List Items Addressed This Visit       Cardiovascular and Mediastinum   Primary hypertension - Primary    Chronic, not controlled.  BP today 160/110.  He was started on lisinopril for his blood pressure, however developed angioedema.  We will have him start on amlodipine 5 mg daily.  Discussed possible side effects.  Encouraged him to get a blood pressure machine and start checking his blood pressure at home.  Discussed nutrition and exercise.  Follow-up in 2 to 3 weeks.      Relevant Medications   amLODipine (NORVASC) 5 MG tablet   Other Relevant Orders   CBC with Differential/Platelet   Comprehensive metabolic panel   Lipid panel     Other   Obesity  (BMI 30-39.9)    BMI 33.6.  Discussed nutrition, exercise.  Other Visit Diagnoses     Urine frequency       With urinary frequency, will check A1c and PSA today.   Relevant Orders   Hemoglobin A1c   PSA   Night sweats       He has been experiencing night sweats, where he drenches the sheets for the last 2 weeks.  We will check CBC, CMP, TB.  Follow-up in 2 to 3 weeks   Relevant Orders   CBC with Differential/Platelet   QuantiFERON-TB Gold Plus   Screening for HIV (human immunodeficiency virus)       Screen for HIV today   Relevant Orders   HIV Antibody (routine testing w rflx)   Encounter for colorectal cancer screening       Colon cancer screening options discussed and he would like to do the Cologuard.  Order placed today   Relevant Orders   Cologuard   Encounter for hepatitis C screening test for low risk patient       Screen for hepatitis C today   Relevant Orders   Hepatitis C antibody        Follow up plan: Return in about 2 weeks (around 05/06/2022) for 2-3 weeks , HTN.

## 2022-04-23 LAB — COMPREHENSIVE METABOLIC PANEL
ALT: 24 U/L (ref 0–53)
AST: 35 U/L (ref 0–37)
Albumin: 4.6 g/dL (ref 3.5–5.2)
Alkaline Phosphatase: 78 U/L (ref 39–117)
BUN: 24 mg/dL — ABNORMAL HIGH (ref 6–23)
CO2: 26 mEq/L (ref 19–32)
Calcium: 9.7 mg/dL (ref 8.4–10.5)
Chloride: 101 mEq/L (ref 96–112)
Creatinine, Ser: 1.08 mg/dL (ref 0.40–1.50)
GFR: 77.46 mL/min (ref >60.00)
Glucose, Bld: 85 mg/dL (ref 70–99)
Potassium: 4 mEq/L (ref 3.5–5.1)
Sodium: 136 mEq/L (ref 135–145)
Total Bilirubin: 0.9 mg/dL (ref 0.2–1.2)
Total Protein: 7.7 g/dL (ref 6.0–8.3)

## 2022-04-23 LAB — CBC WITH DIFFERENTIAL/PLATELET
Basophils Absolute: 0.1 10*3/uL (ref 0.0–0.1)
Basophils Relative: 1.4 % (ref 0.0–3.0)
Eosinophils Absolute: 0.2 10*3/uL (ref 0.0–0.7)
Eosinophils Relative: 2.1 % (ref 0.0–5.0)
HCT: 41.6 % (ref 39.0–52.0)
Hemoglobin: 14.1 g/dL (ref 13.0–17.0)
Lymphocytes Relative: 33.3 % (ref 12.0–46.0)
Lymphs Abs: 2.8 10*3/uL (ref 0.7–4.0)
MCHC: 34 g/dL (ref 30.0–36.0)
MCV: 93.5 fl (ref 78.0–100.0)
Monocytes Absolute: 0.7 10*3/uL (ref 0.1–1.0)
Monocytes Relative: 8.5 % (ref 3.0–12.0)
Neutro Abs: 4.5 10*3/uL (ref 1.4–7.7)
Neutrophils Relative %: 54.7 % (ref 43.0–77.0)
Platelets: 255 10*3/uL (ref 150.0–400.0)
RBC: 4.45 Mil/uL (ref 4.22–5.81)
RDW: 13.2 % (ref 11.5–15.5)
WBC: 8.3 10*3/uL (ref 4.0–10.5)

## 2022-04-23 LAB — LIPID PANEL
Cholesterol: 191 mg/dL (ref 0–200)
HDL: 68.3 mg/dL (ref >39.00)
LDL Cholesterol: 105 mg/dL — ABNORMAL HIGH (ref 0–99)
NonHDL: 122.69
Total CHOL/HDL Ratio: 3
Triglycerides: 87 mg/dL (ref 0.0–149.0)
VLDL: 17.4 mg/dL (ref 0.0–40.0)

## 2022-04-23 LAB — PSA: PSA: 1.28 ng/mL (ref 0.10–4.00)

## 2022-04-23 LAB — HEMOGLOBIN A1C: Hgb A1c MFr Bld: 5.4 % (ref 4.6–6.5)

## 2022-04-24 ENCOUNTER — Telehealth: Payer: Self-pay

## 2022-04-24 NOTE — Telephone Encounter (Signed)
-----   Message from Gerre Scull, NP sent at 04/23/2022  2:44 PM EST ----- I am still waiting on one more test result to see if there is a medical cause for the sweating at night. He can sleep with minimal clothes and use a fan. The medication is for his toenails, correct? I will send in a pill that he can take once a day for 12 weeks once I get the final test result back. He needs to come back in 4 weeks for an office visit.   ----- Message ----- From: Philipp Deputy, CMA Sent: 04/23/2022   2:40 PM EST To: Gerre Scull, NP  Spoke with pt about lab results verbalize understanding. Pt asked about his night sweats and if he can get the medication for his toe. He states that he can pay out of pocket for this medication.

## 2022-04-24 NOTE — Telephone Encounter (Signed)
LVM for pt with instructions and informing him about the med for the toenail. Instructed to call the office with questions and to schedule a 4 week f/u appt.

## 2022-04-26 LAB — QUANTIFERON-TB GOLD PLUS
Mitogen-NIL: >10 IU/mL
NIL: 0.06 IU/mL
QuantiFERON-TB Gold Plus: NEGATIVE
TB1-NIL: 0.01 IU/mL
TB2-NIL: 0.05 IU/mL

## 2022-04-26 LAB — HEPATITIS C ANTIBODY: Hepatitis C Ab: NONREACTIVE

## 2022-04-26 LAB — HIV ANTIBODY (ROUTINE TESTING W REFLEX): HIV 1&2 Ab, 4th Generation: NONREACTIVE

## 2022-05-03 DIAGNOSIS — Z1211 Encounter for screening for malignant neoplasm of colon: Secondary | ICD-10-CM | POA: Diagnosis not present

## 2022-05-03 DIAGNOSIS — Z1212 Encounter for screening for malignant neoplasm of rectum: Secondary | ICD-10-CM | POA: Diagnosis not present

## 2022-05-13 ENCOUNTER — Telehealth: Payer: Self-pay | Admitting: Nurse Practitioner

## 2022-05-13 ENCOUNTER — Ambulatory Visit: Payer: BC Managed Care – PPO | Admitting: Nurse Practitioner

## 2022-05-13 NOTE — Telephone Encounter (Signed)
Pt was a no show 11/29 for and OV with [employer]. This is his first, letter sent

## 2022-05-14 LAB — COLOGUARD: COLOGUARD: NEGATIVE

## 2022-05-18 NOTE — Telephone Encounter (Signed)
1st no show, fee waived, letter sent, RS for 12/5

## 2022-05-19 NOTE — Telephone Encounter (Signed)
Noted  

## 2022-05-22 ENCOUNTER — Ambulatory Visit: Payer: BC Managed Care – PPO | Admitting: Nurse Practitioner

## 2022-05-28 ENCOUNTER — Encounter (HOSPITAL_COMMUNITY): Payer: Self-pay | Admitting: Emergency Medicine

## 2022-05-28 ENCOUNTER — Ambulatory Visit (HOSPITAL_COMMUNITY): Payer: BC Managed Care – PPO

## 2022-05-28 ENCOUNTER — Ambulatory Visit (HOSPITAL_COMMUNITY): Payer: Worker's Compensation

## 2022-05-28 ENCOUNTER — Ambulatory Visit (HOSPITAL_COMMUNITY)
Admission: EM | Admit: 2022-05-28 | Discharge: 2022-05-28 | Disposition: A | Discharge status: Home / Self Care | Payer: Worker's Compensation | Attending: Nurse Practitioner | Admitting: Nurse Practitioner

## 2022-05-28 ENCOUNTER — Other Ambulatory Visit: Payer: Self-pay

## 2022-05-28 DIAGNOSIS — S8391XA Sprain of unspecified site of right knee, initial encounter: Secondary | ICD-10-CM | POA: Diagnosis not present

## 2022-05-28 MED ORDER — NAPROXEN 375 MG PO TABS: 375.0000 mg | ORAL_TABLET | Freq: Two times a day (BID) | ORAL | 0 refills | Status: AC

## 2022-05-28 NOTE — ED Triage Notes (Addendum)
Reports he tripped over a hose and heard and felt right knee pop.  Patient was limping coming from waiting room.  Patient has had surgery on this right knee in the past.    Patient cannot find blood pressure medicine  Patient is not sure if this is workers comp, says he has spoken to [employer]  Patient has not taken any medicines for this.

## 2022-05-28 NOTE — ED Provider Notes (Signed)
[facility]    CSN: [id number] Arrival date & time: 05/28/22  1726      History   Chief Complaint Chief Complaint  Patient presents with   Knee Pain    HPI Jeffrey Walters is a 56 y.o. male presents for evaluation of knee pain.  Patient reports yesterday he was walking at work and tripped twisting his right knee.  Denies fall or trauma to the knee.  He states he did hear a "pop".  He has had some swelling and pain with ambulation but denies any ecchymosis, numbness/tingling/weakness.  He has a history of MCL repair in the past.  He has not taken any OTC medications for symptoms.  He denies any other injuries or concerns at this time.   Knee Pain   Past Medical History:  Diagnosis Date   Hypertension     Patient Active Problem List   Diagnosis Date Noted   Primary hypertension 04/22/2022   Obesity (BMI 30-39.9) 04/22/2022    Past Surgical History:  Procedure Laterality Date   KNEE SURGERY         Home Medications    Prior to Admission medications   Medication Sig Start Date End Date Taking? Authorizing Provider  naproxen (NAPROSYN) 375 MG tablet Take 1 tablet (375 mg total) by mouth 2 (two) times daily for 7 days. 05/28/22 06/04/22 Yes Radford Pax, NP  amLODipine (NORVASC) 5 MG tablet Take 1 tablet (5 mg total) by mouth daily. Patient not taking: Reported on 05/28/2022 04/22/22   Gerre Scull, NP    Family History Family History  Problem Relation Age of Onset   Cancer Mother        breast   Cancer Sister        breast    Social History Social History   Tobacco Use   Smoking status: Former    Packs/day: 1.50    Types: Cigarettes    Quit date: 2003    Years since quitting: 20.9   Smokeless tobacco: Never  Vaping Use   Vaping Use: Never used  Substance Use Topics   Alcohol use: Yes    Comment: socially   Drug use: No     Allergies   Lisinopril   Review of Systems Review of Systems  Musculoskeletal:        Right knee  pain      Physical Exam Triage Vital Signs ED Triage Vitals  Enc Vitals Group     BP 05/28/22 1948 (!) 169/113     Pulse Rate 05/28/22 1948 91     Resp 05/28/22 1948 20     Temp 05/28/22 1948 98.2 F (36.8 C)     Temp Source 05/28/22 1948 Oral     SpO2 05/28/22 1948 98 %     Weight --      Height --      Head Circumference --      Peak Flow --      Pain Score 05/28/22 1946 8     Pain Loc --      Pain Edu? --      Excl. in GC? --    No data found.  Updated Vital Signs BP (!) 169/113 (BP Location: Right Arm)   Pulse 91   Temp 98.2 F (36.8 C) (Oral)   Resp 20   SpO2 98%   Visual Acuity Right Eye Distance:   Left Eye Distance:   Bilateral Distance:    Right Eye Near:  Left Eye Near:    Bilateral Near:     Physical Exam Vitals and nursing note reviewed.  Constitutional:      Appearance: Normal appearance.  HENT:     Head: Normocephalic and atraumatic.  Eyes:     Pupils: Pupils are equal, round, and reactive to light.  Cardiovascular:     Rate and Rhythm: Normal rate.  Pulmonary:     Effort: Pulmonary effort is normal.  Musculoskeletal:     Right knee: No ecchymosis, bony tenderness or crepitus. Normal range of motion. Normal alignment and normal patellar mobility. Normal pulse.     Instability Tests: Anterior drawer test negative. Posterior drawer test negative.     Comments: Positive varus stress test.  Mild swelling of anterior knee without tenderness to palpation.  Strength 5/5 lateral lower extremities  Skin:    General: Skin is warm and dry.  Neurological:     General: No focal deficit present.     Mental Status: He is alert and oriented to person, place, and time.     Deep Tendon Reflexes:     Reflex Scores:      Patellar reflexes are 2+ on the right side. Psychiatric:        Mood and Affect: Mood normal.        Behavior: Behavior normal.      UC Treatments / Results  Labs (all labs ordered are listed, but only abnormal results are  displayed) Labs Reviewed - No data to display  EKG   Radiology No results found.  Procedures Procedures (including critical care time)  Medications Ordered in UC Medications - No data to display  Initial Impression / Assessment and Plan / UC Course  I have reviewed the triage vital signs and the nursing notes.  Pertinent labs & imaging results that were available during my care of the patient were reviewed by me and considered in my medical decision making (see chart for details).     Reviewed exam and symptoms with patient. Patient has not taken his BP medication today. Discussed knee sprain RICE therapy and patient fitted with knee brace Naproxen twice daily for 7 days Patient to follow-up with occupational medicine for further evaluation/treatment/work clearance ER precautions reviewed and patient verbalized understanding Final Clinical Impressions(s) / UC Diagnoses   Final diagnoses:  Sprain of right knee, unspecified ligament, initial encounter     Discharge Instructions      Knee brace as needed Naproxen twice daily for 7 days Rest, elevate, ice to the knee as needed Follow-up with occupational medicine for further evaluation and work restrictions/clearance Please go to the emergency room for any worsening symptoms I hope you feel better soon!   ED Prescriptions     Medication Sig Dispense Auth. Provider   naproxen (NAPROSYN) 375 MG tablet Take 1 tablet (375 mg total) by mouth 2 (two) times daily for 7 days. 14 tablet Radford Pax, NP      PDMP not reviewed this encounter.   Radford Pax, NP 05/28/22 2009

## 2022-05-28 NOTE — Discharge Instructions (Addendum)
Knee brace as needed Naproxen twice daily for 7 days Rest, elevate, ice to the knee as needed Follow-up with occupational medicine for further evaluation and work restrictions/clearance Please go to the emergency room for any worsening symptoms I hope you feel better soon!

## 2022-06-02 ENCOUNTER — Ambulatory Visit
Admission: RE | Admit: 2022-06-02 | Discharge: 2022-06-02 | Disposition: A | Discharge status: Home / Self Care | Payer: Worker's Compensation | Source: Ambulatory Visit | Attending: Family Medicine | Admitting: Family Medicine

## 2022-06-02 ENCOUNTER — Other Ambulatory Visit: Payer: Self-pay | Admitting: Family Medicine

## 2022-06-02 DIAGNOSIS — M25561 Pain in right knee: Secondary | ICD-10-CM

## 2022-06-11 ENCOUNTER — Telehealth: Payer: Self-pay | Admitting: Nurse Practitioner

## 2022-06-11 NOTE — Telephone Encounter (Signed)
Forms received and placed on providers desk. {Lease allow 7-10 business days to complete.

## 2022-06-11 NOTE — Telephone Encounter (Signed)
Type of forms received:FMLA    Routed LM:[employer] Lauren Paperwork received by : Almira Coaster   Individual made aware of 3-5 business day turn around (Y/N):Y  Form completed and patient made aware of charges(Y/N):Y   Faxed to :   Form location:  folder upfront

## 2022-06-12 ENCOUNTER — Telehealth: Payer: Self-pay

## 2022-06-12 NOTE — Telephone Encounter (Signed)
Called pt and left VM, adv he needs to schedule an appt with lauren to have his FMLA forms completed.

## 2022-06-15 NOTE — Progress Notes (Deleted)
   Acute Office Visit  Subjective:     Patient ID: Jeffrey Walters, male    DOB: 01/30/1966, 57 y.o.   MRN: [id number]  No chief complaint on file.   HPI Patient is in today for ***  ROS      Objective:    There were no vitals taken for this visit. {Vitals History (Optional):23777}  Physical Exam  No results found for any visits on 06/17/22.      Assessment & Plan:   Problem List Items Addressed This Visit   None   No orders of the defined types were placed in this encounter.   No follow-ups on file.  Charyl Dancer, NP

## 2022-06-17 ENCOUNTER — Ambulatory Visit: Payer: BC Managed Care – PPO | Admitting: Nurse Practitioner

## 2022-06-17 ENCOUNTER — Telehealth: Payer: Self-pay | Admitting: Nurse Practitioner

## 2022-06-17 NOTE — Telephone Encounter (Signed)
1.3.2024 no show letter sent

## 2022-06-18 NOTE — Telephone Encounter (Signed)
error 

## 2022-06-19 ENCOUNTER — Encounter: Payer: Self-pay | Admitting: Nurse Practitioner

## 2022-06-19 NOTE — Telephone Encounter (Signed)
noted 

## 2022-06-19 NOTE — Telephone Encounter (Signed)
2nd no show, fee generated, final warning sent via mail 

## 2022-06-22 ENCOUNTER — Encounter: Payer: Self-pay | Admitting: Adult Health

## 2022-06-22 ENCOUNTER — Ambulatory Visit (INDEPENDENT_AMBULATORY_CARE_PROVIDER_SITE_OTHER): Payer: BC Managed Care – PPO | Admitting: Adult Health

## 2022-06-22 VITALS — BP 166/102 | HR 96 | Temp 96.7°F | Ht 72.0 in | Wt 244.0 lb

## 2022-06-22 DIAGNOSIS — I10 Essential (primary) hypertension: Secondary | ICD-10-CM

## 2022-06-22 DIAGNOSIS — M25561 Pain in right knee: Secondary | ICD-10-CM | POA: Diagnosis not present

## 2022-06-22 DIAGNOSIS — F1010 Alcohol abuse, uncomplicated: Secondary | ICD-10-CM

## 2022-06-22 MED ORDER — ACETAMINOPHEN 500 MG PO TABS: 1000.0000 mg | ORAL_TABLET | Freq: Four times a day (QID) | ORAL | 11 refills | Status: DC | PRN

## 2022-06-22 MED ORDER — AMLODIPINE BESYLATE 10 MG PO TABS: 10.0000 mg | ORAL_TABLET | Freq: Every day | ORAL | 1 refills | Status: DC

## 2022-06-22 NOTE — Patient Instructions (Signed)
Acute Knee Pain, Adult Many things can cause knee pain. Sometimes, knee pain is sudden (acute) and may be caused by damage, swelling, or irritation of the muscles and tissues that support your knee. The pain often goes away on its own with time and rest. If the pain does not go away, tests may be done to find out what is causing the pain. Follow these instructions at home: If you have a knee sleeve or brace:  Wear the knee sleeve or brace as told by your doctor. Take it off only as told by your doctor. Loosen it if your toes: Tingle. Become numb. Turn cold and blue. Keep it clean. If the knee sleeve or brace is not waterproof: Do not let it get wet. Cover it with a watertight covering when you take a bath or shower. Activity Rest your knee. Do not do things that cause pain or make pain worse. Avoid activities where both feet leave the ground at the same time (high-impact activities). Examples are running, jumping rope, and doing jumping jacks. Work with a physical therapist to make a safe exercise program, as told by your doctor. Managing pain, stiffness, and swelling  If told, put ice on the knee. To do this: If you have a removable knee sleeve or brace, take it off as told by your doctor. Put ice in a plastic bag. Place a towel between your skin and the bag. Leave the ice on for 20 minutes, 2-3 times a day. Take off the ice if your skin turns bright red. This is very important. If you cannot feel pain, heat, or cold, you have a greater risk of damage to the area. If told, use an elastic bandage to put pressure (compression) on your injured knee. Raise your knee above the level of your heart while you are sitting or lying down. Sleep with a pillow under your knee. General instructions Take over-the-counter and prescription medicines only as told by your doctor. Do not smoke or use any products that contain nicotine or tobacco. If you need help quitting, ask your doctor. If you are  overweight, work with your doctor and a food expert (dietitian) to set goals to lose weight. Being overweight can make your knee hurt more. Watch for any changes in your symptoms. Keep all follow-up visits. Contact a doctor if: The knee pain does not stop. The knee pain changes or gets worse. You have a fever along with knee pain. Your knee is red or feels warm when you touch it. Your knee gives out or locks up. Get help right away if: Your knee swells, and the swelling gets worse. You cannot move your knee. You have very bad knee pain that does not get better with pain medicine. Summary Many things can cause knee pain. The pain often goes away on its own with time and rest. Your doctor may do tests to find out the cause of the pain. Watch for any changes in your symptoms. Relieve your pain with rest, medicines, light activity, and use of ice. Get help right away if you cannot move your knee or your knee pain is very bad. This information is not intended to replace advice given to you by your health care provider. Make sure you discuss any questions you have with your health care provider. Document Revised: 11/15/2019 Document Reviewed: 11/15/2019 Elsevier Patient Education  2023 Elsevier Inc.  

## 2022-06-22 NOTE — Progress Notes (Signed)
[facility]  Provider:  Kenard Gower DNP  Code Status:  Full Code  Goals of Care:     03/25/2022   11:49 AM  Advanced Directives  Does Patient Have a Medical Advance Directive? No  Would patient like information on creating a medical advance directive? No - Patient declined     Chief Complaint  Patient presents with   Acute Visit    Patient presents today for right knee pain due to a fall on 05/27/22    HPI: Patient is a 57 y.o. male seen today for an acute visit for right knee pain. He was accompanied today by his girlfriend. Last May 27, 2022, he tripped on a water hose. He did not fall but he heard a pop on his right knee. He was seen at [facility] and patient stated that he was supposed to see a specialist.Imaging done on  his right knee showed small suprapatellar joint effusion. No definite fracture or dislocation. Severe osteoarthritis was noted medially.   He complained of having difficulty walking due to pain, 7/10. He used to walk their dog but cannot now due to right knee pain.  He said that hris right knee pain is now radiating to his right hip and right toes are getting numb X 3 days. Right pedal pulse 2+ noted.  Today BP 166/102, currently takes Amlodipine 5 mg daily for hypertension. He denies headache.  He drinks alcohol every other day, Vodka.   Past Medical History:  Diagnosis Date   Hypertension     Past Surgical History:  Procedure Laterality Date   KNEE SURGERY      Allergies  Allergen Reactions   Lisinopril Swelling    Outpatient Encounter Medications as of 06/22/2022  Medication Sig   amLODipine (NORVASC) 10 MG tablet Take 1 tablet (10 mg total) by mouth daily.   [DISCONTINUED] amLODipine (NORVASC) 5 MG tablet Take 1 tablet (5 mg total) by mouth daily.   No facility-administered encounter medications on file as of 06/22/2022.    Review of Systems:  Review of Systems  Constitutional:  Negative for activity change,  appetite change and fever.  HENT:  Negative for sore throat.   Eyes: Negative.   Cardiovascular:  Negative for chest pain and leg swelling.  Gastrointestinal:  Negative for abdominal distention, diarrhea and vomiting.  Genitourinary:  Negative for dysuria, frequency and urgency.  Musculoskeletal:        Right knee pain  Skin:  Negative for color change.  Neurological:  Negative for dizziness and headaches.  Psychiatric/Behavioral:  Negative for behavioral problems and sleep disturbance. The patient is not nervous/anxious.     Health Maintenance  Topic Date Due   COVID-19 Vaccine (1) Never done   Zoster Vaccines- Shingrix (1 of 2) 07/23/2022 (Originally 03/03/2017)   INFLUENZA VACCINE  09/13/2022 (Originally 01/13/2022)   Fecal DNA (Cologuard)  05/03/2025   DTaP/Tdap/Td (2 - Tdap) 04/22/2028   Hepatitis C Screening  Completed   HIV Screening  Completed   HPV VACCINES  Aged Out    Physical Exam: Vitals:   06/22/22 1445  BP: (!) 166/102  Pulse: 96  Temp: (!) 96.7 F (35.9 C)  SpO2: 97%  Weight: 244 lb (110.7 kg)  Height: 6' (1.829 m)   Body mass index is 33.09 kg/m. Physical Exam Constitutional:      Appearance: He is obese.  HENT:     Head: Normocephalic and atraumatic.     Mouth/Throat:     Mouth: Mucous  membranes are moist.  Eyes:     Conjunctiva/sclera: Conjunctivae normal.  Cardiovascular:     Rate and Rhythm: Normal rate and regular rhythm.     Pulses: Normal pulses.     Heart sounds: Normal heart sounds.  Pulmonary:     Effort: Pulmonary effort is normal.     Breath sounds: Normal breath sounds.  Abdominal:     General: Bowel sounds are normal.     Palpations: Abdomen is soft.  Musculoskeletal:        General: No swelling.     Cervical back: Normal range of motion.     Comments: Right knee brace  Skin:    General: Skin is warm and dry.  Neurological:     General: No focal deficit present.     Mental Status: He is alert and oriented to person, place,  and time.     Gait: Gait abnormal.  Psychiatric:        Mood and Affect: Mood normal.        Behavior: Behavior normal.        Thought Content: Thought content normal.        Judgment: Judgment normal.     Labs reviewed: Basic Metabolic Panel: Recent Labs    03/25/22 1552 04/22/22 1629  NA 134* 136  K 4.0 4.0  CL 99 101  CO2 24 26  GLUCOSE 114* 85  BUN 15 24*  CREATININE 1.02 1.08  CALCIUM 10.0 9.7   Liver Function Tests: Recent Labs    03/25/22 1552 04/22/22 1629  AST 46* 35  ALT 29 24  ALKPHOS 60 78  BILITOT 1.6* 0.9  PROT 8.3* 7.7  ALBUMIN 4.8 4.6   No results for input(s): "LIPASE", "AMYLASE" in the last 8760 hours. No results for input(s): "AMMONIA" in the last 8760 hours. CBC: Recent Labs    03/25/22 1552 04/22/22 1629  WBC 8.2 8.3  NEUTROABS 6.6 4.5  HGB 15.6 14.1  HCT 43.7 41.6  MCV 88.6 93.5  PLT 221 255.0   Lipid Panel: Recent Labs    04/22/22 1629  CHOL 191  HDL 68.30  LDLCALC 105*  TRIG 87.0  CHOLHDL 3   Lab Results  Component Value Date   HGBA1C 5.4 04/22/2022    Procedures since last visit: DG Knee Complete 4 Views Right  Result Date: 06/02/2022 CLINICAL DATA:  Right knee pain after injury 2 days ago EXAM: RIGHT KNEE - COMPLETE 4+ VIEW COMPARISON:  February 17, 2016. FINDINGS: No definite acute fracture or dislocation is noted. Small suprapatellar joint effusion is noted. Severe degenerative changes seen involving medial joint space. Since of osteophyte formation is noted laterally as well. IMPRESSION: Small suprapatellar joint effusion. No definite acute fracture or dislocation. Severe osteoarthritis is noted medially. Electronically Signed   By: Marijo Conception M.D.   On: 06/02/2022 12:56    Assessment/Plan  1. Acute pain of right knee -  continue right knee brace -  excuse letter for work done - Ambulatory referral to Orthopedic Surgery - acetaminophen (TYLENOL) 500 MG tablet; Take 2 tablets (1,000 mg total) by mouth every  6 (six) hours as needed.  Dispense: 30 tablet; Refill: 11  2. Uncontrolled hypertension -  BP elevated -  will increase Amlodipine from 5 mg daily to 10 mg daily -  instructed to check BP daily, log and bring to next appointment - amLODipine (NORVASC) 10 MG tablet; Take 1 tablet (10 mg total) by mouth daily.  Dispense: 30 tablet;  Refill: 1  3. Alcohol abuse -  counseled    Labs/tests ordered:  None  Next appt:  06/24/2022

## 2022-06-24 ENCOUNTER — Encounter: Payer: Self-pay | Admitting: Family

## 2022-06-24 ENCOUNTER — Telehealth: Payer: Self-pay | Admitting: Family

## 2022-06-24 ENCOUNTER — Ambulatory Visit: Payer: BC Managed Care – PPO | Admitting: Nurse Practitioner

## 2022-06-24 ENCOUNTER — Ambulatory Visit (INDEPENDENT_AMBULATORY_CARE_PROVIDER_SITE_OTHER): Payer: BC Managed Care – PPO | Admitting: Family

## 2022-06-24 VITALS — BP 128/88 | HR 106 | Temp 97.8°F | Resp 18 | Ht 72.0 in | Wt 246.0 lb

## 2022-06-24 DIAGNOSIS — E785 Hyperlipidemia, unspecified: Secondary | ICD-10-CM

## 2022-06-24 DIAGNOSIS — I10 Essential (primary) hypertension: Secondary | ICD-10-CM | POA: Diagnosis not present

## 2022-06-24 DIAGNOSIS — Z7689 Persons encountering health services in other specified circumstances: Secondary | ICD-10-CM | POA: Diagnosis not present

## 2022-06-24 NOTE — Telephone Encounter (Signed)
Noted  

## 2022-06-24 NOTE — Telephone Encounter (Signed)
Pt is checking status of this message. Please advise at [phone] [facility])

## 2022-06-24 NOTE — Telephone Encounter (Signed)
Today was 3rd no show visit. Pt has indicated he has changed PCP. Lauren is no longer listed as PCP and I will mail dismissal letter effective today.

## 2022-06-24 NOTE — Progress Notes (Signed)
Provider: Marlowe Sax FNP-C   Chavonne Sforza, Nelda Bucks, NP  Patient Care Team: Rifky Lapre, Nelda Bucks, NP as PCP - General (Family Medicine)  Extended Emergency Contact Information Primary Emergency Contact: Oliver,Sheryl Home Phone: [phone] Relation: Significant other Secondary Emergency Contact: Spence,Dominique Mobile Phone: [phone] Relation: Friend Preferred language: English Interpreter needed? No  Code Status:  Full Code  Goals of care: Advanced Directive information    06/24/2022    2:13 PM  Advanced Directives  Does Patient Have a Medical Advance Directive? No  Would patient like information on creating a medical advance directive? No - Patient declined     Chief Complaint  Patient presents with   Establish Care    New Patient.    HPI:  Pt is a 57 y.o. male seen today establish care here at [facility] for medical management of chronic diseases States was walking at work when tripped and twisted his right knee heard a " Pop" He was seen at [facility] in [place].Knee brace was recommended ,naproxen for 7 days,RICE as needed and was advised to follow up with Occupational medicine.X-ray done 06/02/2022 showed suprapatellar joint effusion and severe Osteoarthritis was noted.No fracture or dislocation noted.he states has difficult walking  not like when he used to walk his dog.He denies any swelling,redness or warm to touch on knee area.   Drinks alcohol voldka times  Former cigarettes stopped in 2016.used smoked couple Cigarettes but shared whenever he bought a case box so not really sure how much he smoked per day.   Does not use any illicit drugs.  States had cologuard done 05/03/2022 and results were negative.  His fasting labs were also done on 04/22/2022 by former PCP.   Past Medical History:  Diagnosis Date   Hypertension    Past Surgical History:  Procedure Laterality Date   KNEE SURGERY      Allergies  Allergen  Reactions   Lisinopril Swelling    Allergies as of 06/24/2022       Reactions   Lisinopril Swelling        Medication List        Accurate as of June 24, 2022  2:38 PM. If you have any questions, ask your nurse or doctor.          acetaminophen 500 MG tablet Commonly known as: TYLENOL Take 2 tablets (1,000 mg total) by mouth every 6 (six) hours as needed.   amLODipine 10 MG tablet Commonly known as: NORVASC Take 1 tablet (10 mg total) by mouth daily.        Review of Systems  Constitutional:  Negative for appetite change, chills, fatigue, fever and unexpected weight change.  HENT:  Negative for congestion, dental problem, ear discharge, ear pain, facial swelling, hearing loss, nosebleeds, postnasal drip, rhinorrhea, sinus pressure, sinus pain, sneezing, sore throat, tinnitus and trouble swallowing.   Eyes:  Negative for pain, discharge, redness, itching and visual disturbance.  Respiratory:  Negative for cough, chest tightness, shortness of breath and wheezing.   Cardiovascular:  Negative for chest pain, palpitations and leg swelling.  Gastrointestinal:  Negative for abdominal distention, abdominal pain, blood in stool, constipation, diarrhea, nausea and vomiting.  Endocrine: Negative for cold intolerance, heat intolerance, polydipsia, polyphagia and polyuria.  Genitourinary:  Negative for difficulty urinating, dysuria, flank pain, frequency and urgency.       Voids 4 times at night   Musculoskeletal:  Negative for arthralgias, back pain, gait problem, joint swelling, myalgias,  neck pain and neck stiffness.  Skin:  Negative for color change, pallor, rash and wound.  Neurological:  Negative for dizziness, syncope, speech difficulty, weakness, light-headedness, numbness and headaches.  Hematological:  Does not bruise/bleed easily.  Psychiatric/Behavioral:  Negative for agitation, behavioral problems, confusion, hallucinations, self-injury, sleep disturbance and  suicidal ideas. The patient is not nervous/anxious.     Immunization History  Administered Date(s) Administered   Td 04/22/2018   Pertinent  Health Maintenance Due  Topic Date Due   INFLUENZA VACCINE  09/13/2022 (Originally 01/13/2022)      10/08/2021    5:50 PM 03/25/2022   11:50 AM 05/28/2022    7:48 PM 06/24/2022    2:13 PM  Fall Risk  Falls in the past year?    0  Was there an injury with Fall?    0  Fall Risk Category Calculator    0  Fall Risk Category    Low  Patient Fall Risk Level Low fall risk Low fall risk Low fall risk Low fall risk  Patient at Risk for Falls Due to    No Fall Risks  Fall risk Follow up    Falls evaluation completed   Functional Status Survey:    Vitals:   06/24/22 1411  BP: 128/88  Pulse: (!) 106  Resp: 18  Temp: 97.8 F (36.6 C)  SpO2: 96%  Weight: 246 lb (111.6 kg)  Height: 6' (1.829 m)   Body mass index is 33.36 kg/m. Physical Exam Vitals reviewed.  Constitutional:      General: He is not in acute distress.    Appearance: Normal appearance. He is obese. He is not ill-appearing or diaphoretic.  HENT:     Head: Normocephalic.     Right Ear: Tympanic membrane, ear canal and external ear normal. There is no impacted cerumen.     Left Ear: Tympanic membrane, ear canal and external ear normal. There is no impacted cerumen.     Nose: Nose normal. No congestion or rhinorrhea.     Mouth/Throat:     Mouth: Mucous membranes are moist.     Pharynx: Oropharynx is clear. No oropharyngeal exudate or posterior oropharyngeal erythema.  Eyes:     General: No scleral icterus.       Right eye: No discharge.        Left eye: No discharge.     Extraocular Movements: Extraocular movements intact.     Conjunctiva/sclera: Conjunctivae normal.     Pupils: Pupils are equal, round, and reactive to light.  Neck:     Vascular: No carotid bruit.  Cardiovascular:     Rate and Rhythm: Normal rate and regular rhythm.     Pulses: Normal pulses.     Heart  sounds: Normal heart sounds. No murmur heard.    No friction rub. No gallop.  Pulmonary:     Effort: Pulmonary effort is normal. No respiratory distress.     Breath sounds: Normal breath sounds. No wheezing, rhonchi or rales.  Chest:     Chest wall: No tenderness.  Abdominal:     General: Bowel sounds are normal. There is no distension.     Palpations: Abdomen is soft. There is no mass.     Tenderness: There is no abdominal tenderness. There is no right CVA tenderness, left CVA tenderness, guarding or rebound.  Musculoskeletal:        General: No swelling or tenderness. Normal range of motion.     Cervical back: Normal range of motion.  No rigidity or tenderness.     Right lower leg: No edema.     Left lower leg: No edema.  Lymphadenopathy:     Cervical: No cervical adenopathy.  Skin:    General: Skin is warm and dry.     Coloration: Skin is not pale.     Findings: No bruising, erythema, lesion or rash.  Neurological:     Mental Status: He is alert and oriented to person, place, and time.     Cranial Nerves: No cranial nerve deficit.     Sensory: No sensory deficit.     Motor: No weakness.     Coordination: Coordination normal.     Gait: Gait normal.  Psychiatric:        Mood and Affect: Mood normal.        Speech: Speech normal.        Behavior: Behavior normal.        Thought Content: Thought content normal.        Judgment: Judgment normal.     Labs reviewed: Recent Labs    03/25/22 1552 04/22/22 1629  NA 134* 136  K 4.0 4.0  CL 99 101  CO2 24 26  GLUCOSE 114* 85  BUN 15 24*  CREATININE 1.02 1.08  CALCIUM 10.0 9.7   Recent Labs    03/25/22 1552 04/22/22 1629  AST 46* 35  ALT 29 24  ALKPHOS 60 78  BILITOT 1.6* 0.9  PROT 8.3* 7.7  ALBUMIN 4.8 4.6   Recent Labs    03/25/22 1552 04/22/22 1629  WBC 8.2 8.3  NEUTROABS 6.6 4.5  HGB 15.6 14.1  HCT 43.7 41.6  MCV 88.6 93.5  PLT 221 255.0   No results found for: "TSH" Lab Results  Component Value  Date   HGBA1C 5.4 04/22/2022   Lab Results  Component Value Date   CHOL 191 04/22/2022   HDL 68.30 04/22/2022   LDLCALC 105 (H) 04/22/2022   TRIG 87.0 04/22/2022   CHOLHDL 3 04/22/2022    Significant Diagnostic Results in last 30 days:  DG Knee Complete 4 Views Right  Result Date: 06/02/2022 CLINICAL DATA:  Right knee pain after injury 2 days ago EXAM: RIGHT KNEE - COMPLETE 4+ VIEW COMPARISON:  February 17, 2016. FINDINGS: No definite acute fracture or dislocation is noted. Small suprapatellar joint effusion is noted. Severe degenerative changes seen involving medial joint space. Since of osteophyte formation is noted laterally as well. IMPRESSION: Small suprapatellar joint effusion. No definite acute fracture or dislocation. Severe osteoarthritis is noted medially. Electronically Signed   By: Lupita Raider M.D.   On: 06/02/2022 12:56    Assessment/Plan 1. Hyperlipidemia LDL goal <100 LDL not at goal  - dietary modification and exercise once right knee pain resolve.  - Lipid panel; Future  2. Essential hypertension Blood pressure well-controlled -Continue on amlodipine 10 mg daily - TSH; Future - COMPLETE METABOLIC PANEL WITH GFR; Future - CBC with Differential/Platelet; Future  3. Encounter to establish care Available records reviewed; recommend scheduling a future fasting blood work.  Immunizations up-to-date except due for COVID- 19 vaccines.  Advised to get vaccine at the pharmacy if needed  Family/ staff Communication: Reviewed plan of care with patient verbalized understanding  Labs/tests ordered: - TSH; Future - COMPLETE METABOLIC PANEL WITH GFR; Future - CBC with Differential/Platelet; Future - Lipid panel; Future  Next Appointment : Return in about 6 months (around 12/23/2022) for medical mangement of chronic issues., Fasting labs in 6  months prior to visit.   Sandrea Hughs, NP

## 2022-06-24 NOTE — Telephone Encounter (Signed)
Do you have  forms for patient,please advise

## 2022-06-24 NOTE — Progress Notes (Signed)
This encounter was created in error - please disregard.

## 2022-06-24 NOTE — Telephone Encounter (Signed)
Pt was a no show for a OV with Lauren on 06/24/22, I did not send a letter. I am on the phone with him now, he called in, he is saying    he had a NP app with another provider today.

## 2022-06-25 ENCOUNTER — Ambulatory Visit: Payer: BC Managed Care – PPO | Admitting: Physician Assistant

## 2022-06-25 NOTE — Telephone Encounter (Signed)
Called pt and discussed that Jeffrey Walters is unable to complete forms due to 2 missed appointments regarding paperwork. Pt was advised of missed appointments 06/17/2022 and 06/24/2022 and no show fee associated with each. Advised of dismissal from office and patient said he has changed to an office closer to his home.   Pt displayed understanding and requested forms be faxed to [facility]. I have faxed forms to [phone].

## 2022-06-25 NOTE — Telephone Encounter (Signed)
Noted  

## 2022-06-26 ENCOUNTER — Ambulatory Visit (INDEPENDENT_AMBULATORY_CARE_PROVIDER_SITE_OTHER): Payer: Worker's Compensation | Admitting: Physician Assistant

## 2022-06-26 ENCOUNTER — Encounter: Payer: Self-pay | Admitting: Physician Assistant

## 2022-06-26 DIAGNOSIS — M25561 Pain in right knee: Secondary | ICD-10-CM

## 2022-06-26 DIAGNOSIS — M12561 Traumatic arthropathy, right knee: Secondary | ICD-10-CM | POA: Diagnosis not present

## 2022-06-26 NOTE — Progress Notes (Signed)
Office Visit Note   Patient: Jeffrey Walters           Date of Birth: [birth year]           MRN: [id number] Visit Date: 06/26/2022              Requested by: Nickola Major, NP [address],  [place] [address] PCP: Sandrea Hughs, NP   Assessment & Plan: Visit Diagnoses:  1. Acute pain of right knee   2. Traumatic arthritis of right knee     Plan: Pleasant 57 year old gentleman who presents today with a 1 month history of right knee pain and instability.  This occurred when he tripped over a hose at work.  He has a history of several years ago having reconstructive surgery.  He thinks it was for his LCL and ACL.  He does state that he had good stability in his knee probably prior to tripping over the hose at work and did hear a pop.  He was given a spider brace to wear.  X-rays and exam demonstrate tricompartmental arthritic changes.  Calcifications are also present laterally.  Clinically he has an ACL deficient knee and LCL concerning as well.  I discussed the patient with Dr. Marlou Sa.  He recommends an MRI specifically to evaluate the status of the LCL.  I had a long conversation with patient and his wife and told him that revision surgery of the ACL in light of his arthritis knee would not be appropriate.  That his best option may be a knee replacement.  He is to remain in the brace.  He will be provided a note to stay off work until after the MRI is evaluated by Dr. Marlou Sa.  He is also working on getting work comp coverage  Follow-Up Instructions: Return Dr. Marlou Sa after MRI.   Orders:  Orders Placed This Encounter  Procedures   MR Knee Right w/o contrast   No orders of the defined types were placed in this encounter.     Procedures: No procedures performed   Clinical Data: No additional findings.   Subjective: Chief Complaint  Patient presents with   Right Knee - Pain    HPI Jeffrey Walters is a pleasant 57 year old gentleman who is approximately 1 month  status post tripping over a hose at work.  He felt a pop in his knee.  Since then he has had medial pain in his knee as well as feelings of instability.  Has not had any treatment other than the emergency room for x-rays and obtaining a brace.  Past history significant for previous reconstructive surgery many years ago.  I do not have access to the operative report but per his description he thinks it was the ACL and the LCL Review of Systems  All other systems reviewed and are negative.    Objective: Vital Signs: There were no vitals taken for this visit.  Physical Exam Constitutional:      Appearance: Normal appearance.  Pulmonary:     Effort: Pulmonary effort is normal.  Skin:    General: Skin is warm and dry.  Neurological:     Mental Status: He is alert.     Ortho Exam Examination nation of his right knee he has a longitudinal lengthy scar on the lateral aspect of his right knee which is well-healed.  No effusion no erythema no redness.  He has focal tenderness mostly over the medial compartment.  He has a soft endpoint with  anterior draw significantly different than his on the affected leg.  Also cannot find a good endpoint with varus testing of the LCL.  Good endpoint on posterior draw.  He is neurovascularly intact distally.  Pulses are palpable.  Foot is warm. Specialty Comments:  No specialty comments available.  Imaging: No results found.   PMFS History: Patient Active Problem List   Diagnosis Date Noted   Traumatic arthritis of right knee 06/26/2022   Primary hypertension 04/22/2022   Obesity (BMI 30-39.9) 04/22/2022   Past Medical History:  Diagnosis Date   Hypertension     Family History  Problem Relation Age of Onset   Cancer Mother        breast   Cancer Sister        breast    Past Surgical History:  Procedure Laterality Date   KNEE SURGERY     Social History   Occupational History   Not on file  Tobacco Use   Smoking status: Former     Packs/day: 1.50    Types: Cigarettes    Quit date: 2003    Years since quitting: 21.0   Smokeless tobacco: Never  Vaping Use   Vaping Use: Never used  Substance and Sexual Activity   Alcohol use: Yes    Comment: socially   Drug use: No   Sexual activity: Yes

## 2022-06-29 ENCOUNTER — Telehealth: Payer: Self-pay

## 2022-06-29 NOTE — Telephone Encounter (Signed)
Patient called to give an alternate contact number in the event that [place] needs to contact him for questions about FMLA [phone]

## 2022-06-29 NOTE — Telephone Encounter (Signed)
Patient called to stress the urgency of completing FMLA paperwork. Patient would like a return call once completed and faxed.   I confirmed that patient is on Dinha's desk for completion

## 2022-06-30 NOTE — Telephone Encounter (Signed)
Patient notified that FMLA Paperwork was ready for pickup. Patient is requesting it to be faxed and will call us back with a Fax Number.   Awaiting return call.

## 2022-06-30 NOTE — Telephone Encounter (Signed)
Tried calling patient regarding FMLA paperwork for [employer]. Paperwork is completed and signed. No fax on paperwork to fax to.  LMOM for patient to return call.  Options to Pick up paperwork or give a fax number to fax to. Awaiting callback.  Paperwork on Clinical Intake desk in RED Form folder.

## 2022-06-30 NOTE — Telephone Encounter (Signed)
Patient called back with Fax#:[phone]  Faxed as requested Copy sent to scanning.

## 2022-06-30 NOTE — Telephone Encounter (Signed)
FMLA paperwork completed fax as requested.

## 2022-06-30 NOTE — Telephone Encounter (Signed)
Outgoing call placed to both numbers, no answer on either and no voicemail

## 2022-07-07 ENCOUNTER — Other Ambulatory Visit (HOSPITAL_COMMUNITY): Payer: Self-pay

## 2022-07-07 ENCOUNTER — Telehealth: Payer: Self-pay | Admitting: Physician Assistant

## 2022-07-07 NOTE — Telephone Encounter (Signed)
Received call from patient. He initially stated he needed his medical records faxed to his attorney. I advised he will need to complete and sign authorizion form. I advised him usuallly the attorney sends the request. He stated they told him he could get them quicker. I advised our turn around time is a few days,. He stated he would let his attorney send the request to [place] since we do not have a long turn around time.

## 2022-07-11 ENCOUNTER — Ambulatory Visit
Admission: RE | Admit: 2022-07-11 | Discharge: 2022-07-11 | Disposition: A | Discharge status: Home / Self Care | Payer: BC Managed Care – PPO | Source: Ambulatory Visit | Attending: Physician Assistant | Admitting: Physician Assistant

## 2022-07-11 DIAGNOSIS — M25561 Pain in right knee: Secondary | ICD-10-CM | POA: Diagnosis not present

## 2022-07-13 ENCOUNTER — Ambulatory Visit (INDEPENDENT_AMBULATORY_CARE_PROVIDER_SITE_OTHER): Payer: Worker's Compensation | Admitting: Orthopedic Surgery

## 2022-07-13 DIAGNOSIS — M12561 Traumatic arthropathy, right knee: Secondary | ICD-10-CM | POA: Diagnosis not present

## 2022-07-13 DIAGNOSIS — M25561 Pain in right knee: Secondary | ICD-10-CM | POA: Diagnosis not present

## 2022-07-16 ENCOUNTER — Encounter: Payer: Self-pay | Admitting: Orthopedic Surgery

## 2022-07-16 NOTE — Progress Notes (Signed)
Office Visit Note   Patient: Jeffrey Walters           Date of Birth: 07/29/1965           MRN: [id number] Visit Date: 07/13/2022 Requested by: Sandrea Hughs, NP [address] [place],  [place] [address] PCP: Sandrea Hughs, NP  Subjective: Chief Complaint  Patient presents with   Right Knee - Pain    HPI: Jeffrey Walters is a 57 y.o. male who presents to the office reporting right knee pain.  Here to review MRI scan.  Date of injury 05/27/2022.  Had pain in the knee for about 6 weeks.  He tripped over a hose at work.  Has a history of knee surgery years ago approximately 13 years.  Describes weakness giving way and swelling.  MRI scan is reviewed.  Shows possible new medial meniscal tear.  Difficult really to assess in this post surgical knee.  ACL graft appears intact.  Chronic avulsion of the biceps femoris tendon insertion site.  Moderate to severe tricompartmental cartilage degenerative changes worsened from prior MRI scan about 13 years ago..                ROS: All systems reviewed are negative as they relate to the chief complaint within the history of present illness.  Patient denies fevers or chills.  Assessment & Plan: Visit Diagnoses:  1. Acute pain of right knee   2. Traumatic arthritis of right knee     Plan: Impression is right knee pain with no clear-cut operative problem in the knee that can be addressed with predictable favorable surgical outcome.  Plan is that the patient has no light duty at work.  Works with [employer].  Needs physical therapy 3 times a week for 6 weeks for quad strengthening.  6-week return for clinical recheck.  Out of work for 6 weeks.  Follow-Up Instructions: Return in about 6 weeks (around 08/24/2022).   Orders:  Orders Placed This Encounter  Procedures   Ambulatory referral to Physical Therapy   No orders of the defined types were placed in this encounter.     Procedures: No procedures performed   Clinical Data: No  additional findings.  Objective: Vital Signs: There were no vitals taken for this visit.  Physical Exam:  Constitutional: Patient appears well-developed HEENT:  Head: Normocephalic Eyes:EOM are normal Neck: Normal range of motion Cardiovascular: Normal rate Pulmonary/chest: Effort normal Neurologic: Patient is alert Skin: Skin is warm Psychiatric: Patient has normal mood and affect  Ortho Exam: Ortho exam demonstrates trace effusion in the knee.  Medial greater than lateral joint line tenderness.  Knee feels stable to anterior drawer and Lachman testing.  Range of motion is nearly full extension to about 1 15-1 20 flexion.  Collaterals are stable.  Has little bit of hamstring weakness on the right compared to the left.  Mary and  Specialty Comments:  No specialty comments available.  Imaging: No results found.   PMFS History: Patient Active Problem List   Diagnosis Date Noted   Traumatic arthritis of right knee 06/26/2022   Primary hypertension 04/22/2022   Obesity (BMI 30-39.9) 04/22/2022   Past Medical History:  Diagnosis Date   Hypertension     Family History  Problem Relation Age of Onset   Cancer Mother        breast   Cancer Sister        breast    Past Surgical History:  Procedure  Laterality Date   KNEE SURGERY     Social History   Occupational History   Not on file  Tobacco Use   Smoking status: Former    Packs/day: 1.50    Types: Cigarettes    Quit date: 2003    Years since quitting: 21.0   Smokeless tobacco: Never  Vaping Use   Vaping Use: Never used  Substance and Sexual Activity   Alcohol use: Yes    Comment: socially   Drug use: No   Sexual activity: Yes

## 2022-07-29 ENCOUNTER — Other Ambulatory Visit: Payer: Self-pay

## 2022-07-29 ENCOUNTER — Ambulatory Visit (INDEPENDENT_AMBULATORY_CARE_PROVIDER_SITE_OTHER): Payer: Worker's Compensation | Admitting: Physical Therapy

## 2022-07-29 ENCOUNTER — Encounter: Payer: Self-pay | Admitting: Physical Therapy

## 2022-07-29 DIAGNOSIS — R600 Localized edema: Secondary | ICD-10-CM | POA: Diagnosis not present

## 2022-07-29 DIAGNOSIS — M6281 Muscle weakness (generalized): Secondary | ICD-10-CM | POA: Diagnosis not present

## 2022-07-29 DIAGNOSIS — M25561 Pain in right knee: Secondary | ICD-10-CM

## 2022-07-29 DIAGNOSIS — R262 Difficulty in walking, not elsewhere classified: Secondary | ICD-10-CM

## 2022-07-29 NOTE — Therapy (Addendum)
OUTPATIENT PHYSICAL THERAPY LOWER EXTREMITY EVALUATION   Patient Name: Jeffrey Walters MRN: [id number] DOB:1965/12/03, 57 y.o., male Today's Date: 07/29/2022  END OF SESSION:  PT End of Session - 07/29/22 1129     Visit Number 1    Number of Visits 16    Date for PT Re-Evaluation 09/25/22    PT Start Time 1122    PT Stop Time 1200    PT Time Calculation (min) 38 min    Activity Tolerance Patient tolerated treatment well    Behavior During Therapy [facility] for tasks assessed/performed             Past Medical History:  Diagnosis Date   Hypertension    Past Surgical History:  Procedure Laterality Date   KNEE SURGERY     Patient Active Problem List   Diagnosis Date Noted   Traumatic arthritis of right knee 06/26/2022   Primary hypertension 04/22/2022   Obesity (BMI 30-39.9) 04/22/2022    PCP: Jeffrey Hughs, NP    REFERRING PROVIDER:  Meredith Pel, MD  REFERRING DIAG: [phone] (ICD-10-CM) - Acute pain of right knee M12.561 (ICD-10-CM) - Traumatic arthritis of right knee  THERAPY DIAG:  Acute pain of right knee  Muscle weakness (generalized)  Difficulty in walking, not elsewhere classified  Localized edema  Rationale for Evaluation and Treatment: Rehabilitation  ONSET DATE: 05/27/22 at work  SUBJECTIVE:     SUBJECTIVE STATEMENT: Pt stating he  tripped over a hose at work.  Has a history of knee surgery years ago approximately 13 years.  Describes weakness giving way and swelling.  MRI scan is reviewed.  Shows possible new medial meniscal tear.   PERTINENT HISTORY: HTN  PAIN:  NPRS scale: 6/10 Pain location: Rt medial knee Pain description: achy, throbbing Aggravating factors: sleeping, taking dog out Relieving factors: ice, heat,   PRECAUTIONS: None  WEIGHT BEARING RESTRICTIONS: No  FALLS:  Has patient fallen in last 6 months? Yes. Number of falls 1 tripped over hose at work  LIVING ENVIRONMENT: Lives with: lives with their  family and lives with their spouse Lives in: House/apartment Stairs: Yes: External: 2 steps; on left going up Has following equipment at home: None  OCCUPATION: industrial saw supervisor  PLOF: Independent  PATIENT GOALS: Be able to walk dog without pain, return to work with out pain  Next MD visit: 08/24/22   OBJECTIVE:   DIAGNOSTIC FINDINGS:  07/11/22: Compared to 08/27/2009:   1. Interval ACL reconstruction. The graft fibers appear intact. 2. New high-grade radial tear within the mid transverse portion of the posterior horn of the medial meniscus with additional superior and inferior articular surface tears of the root of the posterior horn of the medial meniscus. 3. Chronic avulsion of the biceps femoris tendon insertion on the proximal fibula. 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 4. Possible interval repair of the prior full-thickness tear of the iliotibial band insertion. There is attenuation of the distal iliotibial band. 5. Chronic partial-thickness tearing of the popliteus tendon insertion on the lateral femoral condyle. 6. 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 7. Mild joint effusion. Small Baker's cyst.  PATIENT SURVEYS:  FOTO intake:   to be taken at later visit due to pt arriving almost 20 minutes late to his appointment  COGNITION: Overall cognitive status: WFL    SENSATION: WFL  EDEMA:  Circumferential: Rt: 47.5 centimeters       Left 43.5 centimeters  MUSCLE LENGTH:  PALPATION: 07/29/22: Medial joint line tenderness  LOWER EXTREMITY ROM:   ROM Right eval Left eval  Hip flexion    Hip extension    Hip abduction    Hip adduction    Hip internal rotation    Hip external rotation    Knee flexion 110 130  Knee extension 0 0  Ankle dorsiflexion    Ankle plantarflexion    Ankle inversion    Ankle eversion     (Blank rows = not tested)  LOWER  EXTREMITY MMT:  MMT Right eval Left eval  Hip flexion    Hip extension    Hip abduction    Hip adduction    Hip internal rotation    Hip external rotation    Knee flexion  5/5  Knee extension  5/5  Ankle dorsiflexion    Ankle plantarflexion    Ankle inversion    Ankle eversion     (Blank rows = not tested)  LOWER EXTREMITY SPECIAL TESTS:  07/29/22: Knee special tests: Lachman Test: negative  FUNCTIONAL TESTS:  07/29/22:  5 time sit to stand: 28 seconds with UE support   GAIT: Distance walked: 40 feet level surface Assistive device utilized: None Level of assistance: Complete Independence Comments: antalgic gait, limp on the Rt LE with decreased weight bearing, pt wearing a donjoy brace on his Rt knee   TODAY'S TREATMENT                                                                          DATE: 07/29/22 Therex:    HEP instruction/performance c cues for techniques, handout provided.  Trial set performed of each for comprehension and symptom assessment.  See below for exercise list  PATIENT EDUCATION:  Education details: HEP, POC Person educated: Patient Education method: Explanation, Demonstration, Verbal cues, and Handouts Education comprehension: verbalized understanding, returned demonstration, and verbal cues required  HOME EXERCISE PROGRAM: Access Code: [email or web address] URL: [email or web address] Date: 07/29/2022 Prepared by: Kearney Hard  Exercises - Supine Quad Set on Towel Roll  - 2-3 x daily - 7 x weekly - 2 sets - 10 reps - 5 seconds hold - Supine Active Straight Leg Raise  - 2-3 x daily - 7 x weekly - 2 sets - 10 reps - Seated Long Arc Quad  - 2-3 x daily - 7 x weekly - 2 sets - 10 reps - 3 seconds hold - Sit to Stand  - 2-3 x daily - 7 x weekly - 10 reps  ASSESSMENT:  CLINICAL IMPRESSION: Patient is a 57 y.o. who comes to clinic with complaints of Rt knee pain  with dx of possible new medial meniscal tear. Pt has history of ACL tear 13  years ago. Pt with chronic avulsion of the biceps femoris tendon insertion site.  Moderate to severe tricompartmental cartilage degenerative changes worsened from prior MRI scan 3 years ago with mobility, strength and movement coordination deficits that impair their ability to perform usual daily and recreational functional activities without increase difficulty/symptoms at this time.  Patient to benefit from skilled PT services to address impairments and limitations to improve to previous level of function without restriction secondary to condition.   OBJECTIVE IMPAIRMENTS: decreased balance, decreased mobility, difficulty  walking, decreased ROM, decreased strength, and pain.   ACTIVITY LIMITATIONS: lifting, standing, squatting, and stairs  PARTICIPATION LIMITATIONS: community activity, occupation, and yard work  PERSONAL FACTORS: 1 comorbidity: HTN  are also affecting patient's functional outcome.   REHAB POTENTIAL: Good  CLINICAL DECISION MAKING: Stable/uncomplicated  EVALUATION COMPLEXITY: Low   GOALS: Goals reviewed with patient? Yes  SHORT TERM GOALS: (target date for Short term goals are 3 weeks 08/21/22)   1.  Patient will demonstrate independent use of home exercise program to maintain progress from in clinic treatments.  Goal status: New  LONG TERM GOALS: (target dates for all long term goals are 8 weeks  09/25/22 )   1. Patient will demonstrate/report pain at worst less than or equal to 2/10 to facilitate minimal limitation in daily activity secondary to pain symptoms.  Goal status: New   2. Patient will demonstrate independent use of home exercise program to facilitate ability to maintain/progress functional gains from skilled physical therapy services.  Goal status: New      3.  Patient will demonstrate Rt LE MMT 5/5 throughout to faciltiate usual transfers, stairs, squatting at [facility] for daily life.   Goal status: New   4.  Patient will be able to navigate 1 flight  of stairs with no railing with step over step gait pattern.  Goal status: New   5.  Pt will be able to demonstrate full squat with no pain in his Rt knee.  Goal status: New    PLAN:  PT FREQUENCY: 1-2x/week  PT DURATION: 10 weeks  PLANNED INTERVENTIONS: Therapeutic exercises, Therapeutic activity, Neuro Muscular re-education, Balance training, Gait training, Patient/Family education, Joint mobilization, Stair training, DME instructions, Dry Needling, Electrical stimulation, Traction, Cryotherapy, vasopneumatic deviceMoist heat, Taping, Ultrasound, Ionotophoresis '4mg'$ /ml Dexamethasone, and Manual therapy.  All included unless contraindicated  PLAN FOR NEXT SESSION:  FOTO next visit goal if appropriate,  Review HEP knowledge/results, BFR if appropriate,  LE strengthening, bike, hamstring stretching,        Oretha Caprice, PT, MPT 07/29/2022, 11:31 AM

## 2022-08-06 ENCOUNTER — Encounter: Payer: Self-pay | Admitting: Rehabilitative and Restorative Service Providers"

## 2022-08-06 ENCOUNTER — Ambulatory Visit: Payer: Worker's Compensation | Admitting: Rehabilitative and Restorative Service Providers"

## 2022-08-06 DIAGNOSIS — M25561 Pain in right knee: Secondary | ICD-10-CM | POA: Diagnosis not present

## 2022-08-06 DIAGNOSIS — R262 Difficulty in walking, not elsewhere classified: Secondary | ICD-10-CM | POA: Diagnosis not present

## 2022-08-06 DIAGNOSIS — M6281 Muscle weakness (generalized): Secondary | ICD-10-CM

## 2022-08-06 DIAGNOSIS — R600 Localized edema: Secondary | ICD-10-CM | POA: Diagnosis not present

## 2022-08-06 NOTE — Therapy (Addendum)
OUTPATIENT PHYSICAL THERAPY TREATMENT NOTE   Patient Name: Jeffrey Walters MRN: [id number] DOB:09-28-1965, 57 y.o., male Today's Date: 08/06/2022  END OF SESSION:   PT End of Session - 08/06/22 1458     Visit Number 2    Number of Visits 16    Date for PT Re-Evaluation 09/25/22    PT Start Time [id number]    PT Stop Time 1228    PT Time Calculation (min) 43 min    Activity Tolerance Patient tolerated treatment well;No increased pain;Patient limited by fatigue    Behavior During Therapy [facility] for tasks assessed/performed             Past Medical History:  Diagnosis Date   Hypertension    Past Surgical History:  Procedure Laterality Date   KNEE SURGERY     Patient Active Problem List   Diagnosis Date Noted   Traumatic arthritis of right knee 06/26/2022   Primary hypertension 04/22/2022   Obesity (BMI 30-39.9) 04/22/2022     THERAPY DIAG:  Acute pain of right knee  Muscle weakness (generalized)  Difficulty in walking, not elsewhere classified  Localized edema  PCP: Ngetich, Nelda Bucks, NP    REFERRING PROVIDER:  Meredith Pel, MD  REFERRING DIAG: [phone] (ICD-10-CM) - Acute pain of right knee M12.561 (ICD-10-CM) - Traumatic arthritis of right knee  THERAPY DIAG:  Acute pain of right knee  Muscle weakness (generalized)  Difficulty in walking, not elsewhere classified  Localized edema  Rationale for Evaluation and Treatment: Rehabilitation  ONSET DATE: 05/27/22 at work  SUBJECTIVE:     SUBJECTIVE STATEMENT: Gagandeep tripped over a hose at work 05/27/2022, injuring his right knee.  He has a history of an ACL reconstruction on this same knee approximately 13 years ago.  He describes weakness and the knee "giving way" and swelling late in the day.  MRI scan is reviewed.  Shows possible new medial meniscal tear.   PERTINENT HISTORY: HTN  PAIN:  NPRS scale: 3-6/10 Pain location: Rt medial knee Pain description: achy, throbbing Aggravating  factors: sleeping, taking dog out Relieving factors: ice, heat,   PRECAUTIONS: None  WEIGHT BEARING RESTRICTIONS: No  FALLS:  Has patient fallen in last 6 months? Yes. Number of falls 1 tripped over hose at work  LIVING ENVIRONMENT: Lives with: lives with their family and lives with their spouse Lives in: House/apartment Stairs: Yes: External: 2 steps; on left going up Has following equipment at home: None  OCCUPATION: industrial saw supervisor  PLOF: Independent  PATIENT GOALS: Be able to walk dog without pain, return to work with out pain  Next MD visit: 08/24/22   OBJECTIVE:   DIAGNOSTIC FINDINGS:  07/11/22: Compared to 08/27/2009:   1. Interval ACL reconstruction. The graft fibers appear intact. 2. New high-grade radial tear within the mid transverse portion of the posterior horn of the medial meniscus with additional superior and inferior articular surface tears of the root of the posterior horn of the medial meniscus. 3. Chronic avulsion of the biceps femoris tendon insertion on the proximal fibula. 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 4. Possible interval repair of the prior full-thickness tear of the iliotibial band insertion. There is attenuation of the distal iliotibial band. 5. Chronic partial-thickness tearing of the popliteus tendon insertion on the lateral femoral condyle. 6. 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 7. Mild joint effusion. Small Baker's cyst.  PATIENT SURVEYS:  FOTO intake:  40 (59 Goal in  15 visits)   COGNITION: Overall cognitive status: WFL    SENSATION: WFL  EDEMA:  Circumferential: Rt: 47.5 centimeters       Left 43.5 centimeters  MUSCLE LENGTH:    PALPATION: 07/29/22: Medial joint line tenderness  LOWER EXTREMITY ROM:   ROM Right 07/29/22 Left 07/29/22  Hip flexion    Hip extension    Hip abduction    Hip adduction    Hip  internal rotation    Hip external rotation    Knee flexion 110 130  Knee extension 0 0  Ankle dorsiflexion    Ankle plantarflexion    Ankle inversion    Ankle eversion     (Blank rows = not tested)  LOWER EXTREMITY MMT:  MMT Right eval Left eval  Hip flexion    Hip extension    Hip abduction    Hip adduction    Hip internal rotation    Hip external rotation    Knee flexion  5/5  Knee extension  5/5  Ankle dorsiflexion    Ankle plantarflexion    Ankle inversion    Ankle eversion     (Blank rows = not tested)  LOWER EXTREMITY SPECIAL TESTS:  07/29/22: Knee special tests: Lachman Test: negative  FUNCTIONAL TESTS:  07/29/22:  5 time sit to stand: 28 seconds with UE support  GAIT: Distance walked: 40 feet level surface Assistive device utilized: None Level of assistance: Complete Independence Comments: antalgic gait, limp on the Rt LE with decreased weight bearing, pt wearing a donjoy brace on his Rt knee   TODAY'S TREATMENT                                                                          DATE: 08/06/2022 Quadriceps sets 2 sets of 10 for 5 seconds Supine straight leg raises 2 sets of 10 (lift 6-8 inches) Seated knee extensions 5X (held at 5 secondary to anterior knee discomfort) Sit to stand with hands as needed to avoid joint discomfort 10X slow eccentrics Recumbent bike Seat 9 for 5 minutes level 3  Functional Activities: Leg Press double leg 100# 10X slow eccentrics; Single leg press 25# right only 10X slow eccentrics   07/29/22 Therex:    HEP instruction/performance c cues for techniques, handout provided.  Trial set performed of each for comprehension and symptom assessment.  See below for exercise list  PATIENT EDUCATION:  Education details: HEP, POC Person educated: Patient Education method: Explanation, Demonstration, Verbal cues, and Handouts Education comprehension: verbalized understanding, returned demonstration, and verbal cues  required  HOME EXERCISE PROGRAM: Access Code: [email or web address] URL: [email or web address] Date: 07/29/2022 Prepared by: Kearney Hard  Exercises - Supine Quad Set on Towel Roll  - 2-3 x daily - 7 x weekly - 2 sets - 10 reps - 5 seconds hold - Supine Active Straight Leg Raise  - 2-3 x daily - 7 x weekly - 2 sets - 10 reps - Seated Long Arc Quad  - 2-3 x daily - 7 x weekly - 2 sets - 10 reps - 3 seconds hold - Sit to Stand  - 2-3 x daily - 7 x weekly - 10 reps  ASSESSMENT:  CLINICAL IMPRESSION: Nikolai reports some early home exercise  compliance.  He did a good job with his home exercise recall today and will benefit from continued quadriceps strengthening.  Fatigue was evident during the treatment and with single-leg activities.  Annie Main and I discussed the importance of improving his primary shock absorber of his knee, his quadriceps muscle group and we reviewed the importance of adequate home program participation to meet his long-term goals.  OBJECTIVE IMPAIRMENTS: decreased balance, decreased mobility, difficulty walking, decreased ROM, decreased strength, and pain.   ACTIVITY LIMITATIONS: lifting, standing, squatting, and stairs  PARTICIPATION LIMITATIONS: community activity, occupation, and yard work  PERSONAL FACTORS: 1 comorbidity: HTN are also affecting patient's functional outcome.   REHAB POTENTIAL: Good  CLINICAL DECISION MAKING: Stable/uncomplicated  EVALUATION COMPLEXITY: Low   GOALS: Goals reviewed with patient? Yes  SHORT TERM GOALS: (target date for Short term goals are 3 weeks 08/21/22)   1.  Patient will demonstrate independent use of home exercise program to maintain progress from in clinic treatments.  Goal status: On Going 08/06/2022  LONG TERM GOALS: (target dates for all long term goals are 8 weeks  09/25/22 )   1. Patient will demonstrate/report pain at worst less than or equal to 2/10 to facilitate minimal limitation in daily activity  secondary to pain symptoms.  Goal status: New   2. Patient will demonstrate independent use of home exercise program to facilitate ability to maintain/progress functional gains from skilled physical therapy services.  Goal status: New      3.  Patient will demonstrate Rt LE MMT 5/5 throughout to faciltiate usual transfers, stairs, squatting at [facility] for daily life.   Goal status: New   4.  Patient will be able to navigate 1 flight of stairs with no railing with step over step gait pattern.  Goal status: New   5.  Pt will be able to demonstrate full squat with no pain in his Rt knee.  Goal status: New    PLAN:  PT FREQUENCY: 1-2x/week  PT DURATION: 10 weeks  PLANNED INTERVENTIONS: Therapeutic exercises, Therapeutic activity, Neuro Muscular re-education, Balance training, Gait training, Patient/Family education, Joint mobilization, Stair training, DME instructions, Dry Needling, Electrical stimulation, Traction, Cryotherapy, vasopneumatic deviceMoist heat, Taping, Ultrasound, Ionotophoresis '4mg'$ /ml Dexamethasone, and Manual therapy.  All included unless contraindicated  PLAN FOR NEXT SESSION:  Review HEP knowledge/results, BFR if appropriate,  LE (quadriceps emphasis) strengthening, bike, hamstrings stretching.   Farley Ly, PT, MPT 08/06/2022, 3:05 PM

## 2022-08-11 ENCOUNTER — Ambulatory Visit (INDEPENDENT_AMBULATORY_CARE_PROVIDER_SITE_OTHER): Payer: Worker's Compensation | Admitting: Physical Therapy

## 2022-08-11 ENCOUNTER — Encounter: Payer: Self-pay | Admitting: Physical Therapy

## 2022-08-11 DIAGNOSIS — M25561 Pain in right knee: Secondary | ICD-10-CM

## 2022-08-11 DIAGNOSIS — M6281 Muscle weakness (generalized): Secondary | ICD-10-CM

## 2022-08-11 DIAGNOSIS — R600 Localized edema: Secondary | ICD-10-CM | POA: Diagnosis not present

## 2022-08-11 DIAGNOSIS — R262 Difficulty in walking, not elsewhere classified: Secondary | ICD-10-CM | POA: Diagnosis not present

## 2022-08-11 NOTE — Therapy (Signed)
OUTPATIENT PHYSICAL THERAPY TREATMENT NOTE   Patient Name: Jeffrey Walters MRN: [id number] DOB:1965-10-04, 57 y.o., male Today's Date: 08/11/2022  END OF SESSION:   PT End of Session - 08/11/22 1124     Visit Number 3    Number of Visits 16    Date for PT Re-Evaluation 09/25/22    PT Start Time 1105    PT Stop Time 1145    PT Time Calculation (min) 40 min    Activity Tolerance Patient tolerated treatment well    Behavior During Therapy [facility] for tasks assessed/performed              Past Medical History:  Diagnosis Date   Hypertension    Past Surgical History:  Procedure Laterality Date   KNEE SURGERY     Patient Active Problem List   Diagnosis Date Noted   Traumatic arthritis of right knee 06/26/2022   Primary hypertension 04/22/2022   Obesity (BMI 30-39.9) 04/22/2022     THERAPY DIAG:  Acute pain of right knee  Muscle weakness (generalized)  Difficulty in walking, not elsewhere classified  Localized edema  PCP: Ngetich, Nelda Bucks, NP    REFERRING PROVIDER:  Meredith Pel, MD  REFERRING DIAG: [phone] (ICD-10-CM) - Acute pain of right knee M12.561 (ICD-10-CM) - Traumatic arthritis of right knee  THERAPY DIAG:  Acute pain of right knee  Muscle weakness (generalized)  Difficulty in walking, not elsewhere classified  Localized edema  Rationale for Evaluation and Treatment: Rehabilitation  ONSET DATE: 05/27/22 at work   SUBJECTIVE STATEMENT: Pt arriving today reporting 3/10 pain in his Rt knee. Pt reporting compliance in his HEP. Pt stating his knee seems to be doing better since starting the exercises.   PERTINENT HISTORY: HTN  PAIN:  NPRS scale: 3/10 Pain location: Rt medial knee, behind knee cap Pain description: achy, throbbing Aggravating factors: sleeping, taking dog out Relieving factors: ice, heat,   PRECAUTIONS: None  WEIGHT BEARING RESTRICTIONS: No  FALLS:  Has patient fallen in last 6 months? Yes. Number of falls 1  tripped over hose at work  LIVING ENVIRONMENT: Lives with: lives with their family and lives with their spouse Lives in: House/apartment Stairs: Yes: External: 2 steps; on left going up Has following equipment at home: None  OCCUPATION: industrial saw supervisor  PLOF: Independent  PATIENT GOALS: Be able to walk dog without pain, return to work with out pain  Next MD visit: 08/24/22   OBJECTIVE:   DIAGNOSTIC FINDINGS:  07/11/22: Compared to 08/27/2009:   1. Interval ACL reconstruction. The graft fibers appear intact. 2. New high-grade radial tear within the mid transverse portion of the posterior horn of the medial meniscus with additional superior and inferior articular surface tears of the root of the posterior horn of the medial meniscus. 3. Chronic avulsion of the biceps femoris tendon insertion on the proximal fibula. 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 4. Possible interval repair of the prior full-thickness tear of the iliotibial band insertion. There is attenuation of the distal iliotibial band. 5. Chronic partial-thickness tearing of the popliteus tendon insertion on the lateral femoral condyle. 6. 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 7. Mild joint effusion. Small Baker's cyst.  PATIENT SURVEYS:  FOTO intake:  40 (59 Goal in 15 visits)   COGNITION: Overall cognitive status: WFL    SENSATION: WFL  EDEMA:  Circumferential: Rt: 47.5 centimeters       Left 43.5 centimeters  MUSCLE  LENGTH:    PALPATION: 07/29/22: Medial joint line tenderness  LOWER EXTREMITY ROM:   ROM Right 07/29/22 Left 07/29/22 Rt 08/11/22  Hip flexion     Hip extension     Hip abduction     Hip adduction     Hip internal rotation     Hip external rotation     Knee flexion 110 130 118  Knee extension 0 0 0  Ankle dorsiflexion     Ankle plantarflexion     Ankle inversion     Ankle  eversion      (Blank rows = not tested)  LOWER EXTREMITY MMT:  MMT Right eval Left eval  Hip flexion    Hip extension    Hip abduction    Hip adduction    Hip internal rotation    Hip external rotation    Knee flexion  5/5  Knee extension  5/5  Ankle dorsiflexion    Ankle plantarflexion    Ankle inversion    Ankle eversion     (Blank rows = not tested)  LOWER EXTREMITY SPECIAL TESTS:  07/29/22: Knee special tests: Lachman Test: negative  FUNCTIONAL TESTS:  07/29/22:  5 time sit to stand: 28 seconds with UE support  GAIT: Distance walked: 40 feet level surface Assistive device utilized: None Level of assistance: Complete Independence Comments: antalgic gait, limp on the Rt LE with decreased weight bearing, pt wearing a donjoy brace on his Rt knee   TODAY'S TREATMENT                                                                          DATE: 08/11/22 TherEx:  Nustep Level 5 x 8 minutes Rt SLR: 2 x 10  Hamstring stretch x 3 holding 20 seconds using strap Seated ball squeezes x 15 holding 5 seconds LAQ x 20 holding 3 sec Leg Press double leg 100# 2 x 10 slow eccentrics Leg Press: Rt LE only: 43# 2 x 10 Neuromuscular Re-edu: SLS: Rt LE x 3 best time: seconds Side stepping: 20 feet x 4 Tandum stance: x 3 best time: seconds Step ups on 4 inch step x 10   08/06/2022 Quadriceps sets 2 sets of 10 for 5 seconds Supine straight leg raises 2 sets of 10 (lift 6-8 inches) Seated knee extensions 5X (held at 5 secondary to anterior knee discomfort) Sit to stand with hands as needed to avoid joint discomfort 10X slow eccentrics Recumbent bike Seat 9 for 5 minutes level 3  Functional Activities: Leg Press double leg 100# 10X slow eccentrics; Single leg press 25# right only 10X slow eccentrics   07/29/22 Therex:    HEP instruction/performance c cues for techniques, handout provided.  Trial set performed of each for comprehension and symptom assessment.  See below for  exercise list  PATIENT EDUCATION:  Education details: HEP, POC Person educated: Patient Education method: Explanation, Demonstration, Verbal cues, and Handouts Education comprehension: verbalized understanding, returned demonstration, and verbal cues required  HOME EXERCISE PROGRAM: Access Code: [email or web address] URL: [email or web address] Date: 07/29/2022 Prepared by: Kearney Hard  Exercises - Supine Quad Set on Towel Roll  - 2-3 x daily - 7 x weekly - 2 sets - 10 reps - 5 seconds hold -  Supine Active Straight Leg Raise  - 2-3 x daily - 7 x weekly - 2 sets - 10 reps - Seated Long Arc Quad  - 2-3 x daily - 7 x weekly - 2 sets - 10 reps - 3 seconds hold - Sit to Stand  - 2-3 x daily - 7 x weekly - 10 reps  ASSESSMENT:  CLINICAL IMPRESSION: Pt tolerating all exercises well. Pt reporting compliance in his HEP. Pt's ROM was updated. Treatment focusing on Quadriceps strengthening. Muscle fatigue noted throughout session. Pt was able to demonstrate SLR x 15 with no extensor lag noted. Continue skilled PT to progress pt toward PLOF.   OBJECTIVE IMPAIRMENTS: decreased balance, decreased mobility, difficulty walking, decreased ROM, decreased strength, and pain.   ACTIVITY LIMITATIONS: lifting, standing, squatting, and stairs  PARTICIPATION LIMITATIONS: community activity, occupation, and yard work  PERSONAL FACTORS: 1 comorbidity: HTN are also affecting patient's functional outcome.   REHAB POTENTIAL: Good  CLINICAL DECISION MAKING: Stable/uncomplicated  EVALUATION COMPLEXITY: Low   GOALS: Goals reviewed with patient? Yes  SHORT TERM GOALS: (target date for Short term goals are 3 weeks 08/21/22)   1.  Patient will demonstrate independent use of home exercise program to maintain progress from in clinic treatments.  Goal status: On Going 08/11/2022  LONG TERM GOALS: (target dates for all long term goals are 8 weeks  09/25/22 )   1. Patient will demonstrate/report pain at  worst less than or equal to 2/10 to facilitate minimal limitation in daily activity secondary to pain symptoms.  Goal status: New   2. Patient will demonstrate independent use of home exercise program to facilitate ability to maintain/progress functional gains from skilled physical therapy services.  Goal status: New      3.  Patient will demonstrate Rt LE MMT 5/5 throughout to faciltiate usual transfers, stairs, squatting at [facility] for daily life.   Goal status: New   4.  Patient will be able to navigate 1 flight of stairs with no railing with step over step gait pattern.  Goal status: New   5.  Pt will be able to demonstrate full squat with no pain in his Rt knee.  Goal status: New    PLAN:  PT FREQUENCY: 1-2x/week  PT DURATION: 10 weeks  PLANNED INTERVENTIONS: Therapeutic exercises, Therapeutic activity, Neuro Muscular re-education, Balance training, Gait training, Patient/Family education, Joint mobilization, Stair training, DME instructions, Dry Needling, Electrical stimulation, Traction, Cryotherapy, vasopneumatic deviceMoist heat, Taping, Ultrasound, Ionotophoresis '4mg'$ /ml Dexamethasone, and Manual therapy.  All included unless contraindicated  PLAN FOR NEXT SESSION:  BFR if appropriate,  LE (quadriceps emphasis) strengthening, bike, hamstrings stretching.   Oretha Caprice, PT, MPT 08/11/2022, 11:25 AM

## 2022-08-13 ENCOUNTER — Encounter: Payer: Self-pay | Admitting: Rehabilitative and Restorative Service Providers"

## 2022-08-13 ENCOUNTER — Ambulatory Visit: Payer: Worker's Compensation | Admitting: Rehabilitative and Restorative Service Providers"

## 2022-08-13 DIAGNOSIS — R600 Localized edema: Secondary | ICD-10-CM | POA: Diagnosis not present

## 2022-08-13 DIAGNOSIS — M25561 Pain in right knee: Secondary | ICD-10-CM

## 2022-08-13 DIAGNOSIS — R262 Difficulty in walking, not elsewhere classified: Secondary | ICD-10-CM | POA: Diagnosis not present

## 2022-08-13 DIAGNOSIS — M6281 Muscle weakness (generalized): Secondary | ICD-10-CM | POA: Diagnosis not present

## 2022-08-13 NOTE — Therapy (Signed)
OUTPATIENT PHYSICAL THERAPY TREATMENT NOTE   Patient Name: Jeffrey Walters MRN: [id number] DOB:[birth year], 57 y.o., male Today's Date: 08/13/2022  END OF SESSION:   PT End of Session - 08/13/22 1105     Visit Number 4    Number of Visits 16    Date for PT Re-Evaluation 09/25/22    PT Start Time 1104    PT Stop Time [id number]    PT Time Calculation (min) 48 min    Activity Tolerance Patient tolerated treatment well;No increased pain    Behavior During Therapy [facility] for tasks assessed/performed             Past Medical History:  Diagnosis Date   Hypertension    Past Surgical History:  Procedure Laterality Date   KNEE SURGERY     Patient Active Problem List   Diagnosis Date Noted   Traumatic arthritis of right knee 06/26/2022   Primary hypertension 04/22/2022   Obesity (BMI 30-39.9) 04/22/2022     THERAPY DIAG:  Acute pain of right knee  Muscle weakness (generalized)  Difficulty in walking, not elsewhere classified  Localized edema  PCP: Ngetich, Nelda Bucks, NP    REFERRING PROVIDER:  Meredith Pel, MD  REFERRING DIAG: [phone] (ICD-10-CM) - Acute pain of right knee M12.561 (ICD-10-CM) - Traumatic arthritis of right knee  THERAPY DIAG:  Acute pain of right knee  Muscle weakness (generalized)  Difficulty in walking, not elsewhere classified  Localized edema  Rationale for Evaluation and Treatment: Rehabilitation  ONSET DATE: 05/27/22 at work   SUBJECTIVE STATEMENT: Jeffrey Walters reports and demonstrates good HEP compliance.  He has a noticeable limp with gait.  PERTINENT HISTORY: HTN  PAIN:  NPRS scale: 3-4/10 this week Pain location: Rt medial knee, behind knee cap Pain description: achy, throbbing Aggravating factors: sleeping, taking dog out Relieving factors: ice, heat,   PRECAUTIONS: None  WEIGHT BEARING RESTRICTIONS: No  FALLS:  Has patient fallen in last 6 months? Yes. Number of falls 1 tripped over hose at work  LIVING  ENVIRONMENT: Lives with: lives with their family and lives with their spouse Lives in: House/apartment Stairs: Yes: External: 2 steps; on left going up Has following equipment at home: None  OCCUPATION: industrial saw supervisor  PLOF: Independent  PATIENT GOALS: Be able to walk dog without pain, return to work with out pain  Next MD visit: 08/24/22   OBJECTIVE:   DIAGNOSTIC FINDINGS:  07/11/22: Compared to 08/27/2009:   1. Interval ACL reconstruction. The graft fibers appear intact. 2. New high-grade radial tear within the mid transverse portion of the posterior horn of the medial meniscus with additional superior and inferior articular surface tears of the root of the posterior horn of the medial meniscus. 3. Chronic avulsion of the biceps femoris tendon insertion on the proximal fibula. 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 4. Possible interval repair of the prior full-thickness tear of the iliotibial band insertion. There is attenuation of the distal iliotibial band. 5. Chronic partial-thickness tearing of the popliteus tendon insertion on the lateral femoral condyle. 6. 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 7. Mild joint effusion. Small Baker's cyst.  PATIENT SURVEYS:  FOTO intake:  40 (59 Goal in 15 visits)   COGNITION: Overall cognitive status: WFL    SENSATION: WFL  EDEMA:  Circumferential: Rt: 47.5 centimeters       Left 43.5 centimeters  MUSCLE LENGTH:    PALPATION: 07/29/22: Medial joint line tenderness  LOWER  EXTREMITY ROM:   ROM Right 07/29/22 Left 07/29/22 Rt 08/11/22  Hip flexion     Hip extension     Hip abduction     Hip adduction     Hip internal rotation     Hip external rotation     Knee flexion 110 130 118  Knee extension 0 0 0  Ankle dorsiflexion     Ankle plantarflexion     Ankle inversion     Ankle eversion      (Blank rows = not  tested)  LOWER EXTREMITY MMT:  MMT Right eval Left eval  Hip flexion    Hip extension    Hip abduction    Hip adduction    Hip internal rotation    Hip external rotation    Knee flexion  5/5  Knee extension  5/5  Ankle dorsiflexion    Ankle plantarflexion    Ankle inversion    Ankle eversion     (Blank rows = not tested)  LOWER EXTREMITY SPECIAL TESTS:  07/29/22: Knee special tests: Lachman Test: negative  FUNCTIONAL TESTS:  07/29/22:  5 time sit to stand: 28 seconds with UE support  GAIT: Distance walked: 40 feet level surface Assistive device utilized: None Level of assistance: Complete Independence Comments: antalgic gait, limp on the Rt LE with decreased weight bearing, pt wearing a donjoy brace on his Rt knee   TODAY'S TREATMENT                                                                          DATE: 08/13/2022 Nustep Level 5 for 5 minutes Quadriceps sets 2 sets of 10 for 5 seconds Supine straight leg raises 4 sets of 5 (lift 6-8 inches) with 1# Sit to stand with hands as needed to avoid joint discomfort 10X slow eccentrics  Functional Activities: Leg Press double leg 112# 15X slow eccentrics; Single leg press 37# right only 10X slow eccentrics  Neuromuscular re-education: Tandem balance: eyes open 4X 20 seconds each   08/11/22 TherEx:  Nustep Level 5 x 8 minutes Rt SLR: 2 x 10  Hamstring stretch x 3 holding 20 seconds using strap Seated ball squeezes x 15 holding 5 seconds LAQ x 20 holding 3 sec Leg Press double leg 100# 2 x 10 slow eccentrics Leg Press: Rt LE only: 43# 2 x 10 Neuromuscular Re-edu: SLS: Rt LE x 3 best time: seconds Side stepping: 20 feet x 4 Tandum stance: x 3 best time: seconds Step ups on 4 inch step x 10   08/06/2022 Quadriceps sets 2 sets of 10 for 5 seconds Supine straight leg raises 2 sets of 10 (lift 6-8 inches) Seated knee extensions 5X (held at 5 secondary to anterior knee discomfort) Sit to stand with hands as  needed to avoid joint discomfort 10X slow eccentrics Recumbent bike Seat 9 for 5 minutes level 3  Functional Activities: Leg Press double leg 100# 10X slow eccentrics; Single leg press 25# right only 10X slow eccentrics   PATIENT EDUCATION:  Education details: HEP, POC Person educated: Patient Education method: Explanation, Demonstration, Verbal cues, and Handouts Education comprehension: verbalized understanding, returned demonstration, and verbal cues required  HOME EXERCISE PROGRAM: Access Code: [email or web address] URL: [email or web address] Date:  07/29/2022 Prepared by: Kearney Hard  Exercises - Supine Quad Set on Towel Roll  - 2-3 x daily - 7 x weekly - 2 sets - 10 reps - 5 seconds hold - Supine Active Straight Leg Raise  - 2-3 x daily - 7 x weekly - 2 sets - 10 reps - Seated Long Arc Quad  - 2-3 x daily - 7 x weekly - 2 sets - 10 reps - 3 seconds hold - Sit to Stand  - 2-3 x daily - 7 x weekly - 10 reps  ASSESSMENT:  CLINICAL IMPRESSION: Quadriceps strengthening remains the early emphasis with [facility].  We reinforced the importance of thigh soreness being OK, but avoiding medial joint line pain.  Continue POC.  OBJECTIVE IMPAIRMENTS: decreased balance, decreased mobility, difficulty walking, decreased ROM, decreased strength, and pain.   ACTIVITY LIMITATIONS: lifting, standing, squatting, and stairs  PARTICIPATION LIMITATIONS: community activity, occupation, and yard work  PERSONAL FACTORS: 1 comorbidity: HTN are also affecting patient's functional outcome.   REHAB POTENTIAL: Good  CLINICAL DECISION MAKING: Stable/uncomplicated  EVALUATION COMPLEXITY: Low   GOALS: Goals reviewed with patient? Yes  SHORT TERM GOALS: (target date for Short term goals are 3 weeks 08/21/22)   1.  Patient will demonstrate independent use of home exercise program to maintain progress from in clinic treatments.  Goal status: On Going 08/13/2022  LONG  TERM GOALS: (target dates for all long term goals are 8 weeks  09/25/22 )   1. Patient will demonstrate/report pain at worst less than or equal to 2/10 to facilitate minimal limitation in daily activity secondary to pain symptoms.  Goal status: On Going 08/13/2022   2. Patient will demonstrate independent use of home exercise program to facilitate ability to maintain/progress functional gains from skilled physical therapy services.  Goal status: On Going 08/13/2022      3.  Patient will demonstrate Rt LE MMT 5/5 throughout to faciltiate usual transfers, stairs, squatting at [facility] for daily life.   Goal status: On Going 08/13/2022   4.  Patient will be able to navigate 1 flight of stairs with no railing with step over step gait pattern.  Goal status: On Going 08/13/2022   5.  Pt will be able to demonstrate full squat with no pain in his Rt knee.  Goal status: On Going 08/13/2022    PLAN:  PT FREQUENCY: 1-2x/week  PT DURATION: 10 weeks  PLANNED INTERVENTIONS: Therapeutic exercises, Therapeutic activity, Neuro Muscular re-education, Balance training, Gait training, Patient/Family education, Joint mobilization, Stair training, DME instructions, Dry Needling, Electrical stimulation, Traction, Cryotherapy, vasopneumatic deviceMoist heat, Taping, Ultrasound, Ionotophoresis '4mg'$ /ml Dexamethasone, and Manual therapy.  All included unless contraindicated  PLAN FOR NEXT SESSION:  BFR if appropriate,  LE (quadriceps emphasis) strengthening, bike, hamstrings stretching.   Farley Ly, PT, MPT 08/13/2022, 4:48 PM

## 2022-08-19 ENCOUNTER — Encounter: Payer: Self-pay | Admitting: Physical Therapy

## 2022-08-19 ENCOUNTER — Ambulatory Visit (INDEPENDENT_AMBULATORY_CARE_PROVIDER_SITE_OTHER): Payer: Worker's Compensation | Admitting: Physical Therapy

## 2022-08-19 DIAGNOSIS — M25561 Pain in right knee: Secondary | ICD-10-CM

## 2022-08-19 DIAGNOSIS — R262 Difficulty in walking, not elsewhere classified: Secondary | ICD-10-CM | POA: Diagnosis not present

## 2022-08-19 DIAGNOSIS — M6281 Muscle weakness (generalized): Secondary | ICD-10-CM | POA: Diagnosis not present

## 2022-08-19 DIAGNOSIS — R600 Localized edema: Secondary | ICD-10-CM

## 2022-08-19 NOTE — Therapy (Signed)
OUTPATIENT PHYSICAL THERAPY TREATMENT NOTE   Patient Name: Jeffrey Walters MRN: [id number] DOB:[birth year], 57 y.o., male Today's Date: 08/19/2022  END OF SESSION:   PT End of Session - 08/19/22 1104     Visit Number 5    Number of Visits 16    Date for PT Re-Evaluation 09/25/22    Authorization Type WC - 12 visits approved    Authorization - Visit Number 5    Authorization - Number of Visits 12    PT Start Time 1100    PT Stop Time 1141    PT Time Calculation (min) 41 min    Activity Tolerance Patient tolerated treatment well;No increased pain    Behavior During Therapy [facility] for tasks assessed/performed              Past Medical History:  Diagnosis Date   Hypertension    Past Surgical History:  Procedure Laterality Date   KNEE SURGERY     Patient Active Problem List   Diagnosis Date Noted   Traumatic arthritis of right knee 06/26/2022   Primary hypertension 04/22/2022   Obesity (BMI 30-39.9) 04/22/2022     THERAPY DIAG:  Acute pain of right knee  Muscle weakness (generalized)  Difficulty in walking, not elsewhere classified  Localized edema  PCP: Ngetich, Nelda Bucks, NP    REFERRING PROVIDER:  Meredith Pel, MD  REFERRING DIAG: [phone] (ICD-10-CM) - Acute pain of right knee M12.561 (ICD-10-CM) - Traumatic arthritis of right knee  THERAPY DIAG:  Acute pain of right knee  Muscle weakness (generalized)  Difficulty in walking, not elsewhere classified  Localized edema  Rationale for Evaluation and Treatment: Rehabilitation  ONSET DATE: 05/27/22 at work   SUBJECTIVE STATEMENT: Doing okay, still limping some today.  Doing exercises "every commercial."  PERTINENT HISTORY: HTN  PAIN:  NPRS scale: 3/10 this week Pain location: Rt medial knee, behind knee cap Pain description: achy, throbbing Aggravating factors: sleeping, taking dog out Relieving factors: ice, heat,   PRECAUTIONS: None  WEIGHT BEARING RESTRICTIONS: No  FALLS:   Has patient fallen in last 6 months? Yes. Number of falls 1 tripped over hose at work  LIVING ENVIRONMENT: Lives with: lives with their family and lives with their spouse Lives in: House/apartment Stairs: Yes: External: 2 steps; on left going up Has following equipment at home: None  OCCUPATION: industrial saw supervisor  PLOF: Independent  PATIENT GOALS: Be able to walk dog without pain, return to work with out pain  Next MD visit: 08/24/22   OBJECTIVE:   DIAGNOSTIC FINDINGS:  07/11/22: Compared to 08/27/2009:   1. Interval ACL reconstruction. The graft fibers appear intact. 2. New high-grade radial tear within the mid transverse portion of the posterior horn of the medial meniscus with additional superior and inferior articular surface tears of the root of the posterior horn of the medial meniscus. 3. Chronic avulsion of the biceps femoris tendon insertion on the proximal fibula. 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 4. Possible interval repair of the prior full-thickness tear of the iliotibial band insertion. There is attenuation of the distal iliotibial band. 5. Chronic partial-thickness tearing of the popliteus tendon insertion on the lateral femoral condyle. 6. 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 7. Mild joint effusion. Small Baker's cyst.  PATIENT SURVEYS:  EVAL: FOTO intake:  40 (59 Goal in 15 visits)   EDEMA:  EVAL: Circumferential: Rt: 47.5 centimeters    Left 43.5 centimeters  PALPATION:  07/29/22: Medial joint line tenderness  LOWER EXTREMITY ROM:   ROM Right 07/29/22 Left 07/29/22 Rt 08/11/22  Knee flexion 110 130 118  Knee extension 0 0 0   (Blank rows = not tested)  LOWER EXTREMITY MMT:  MMT Right eval Left eval  Knee flexion  5/5  Knee extension  5/5   (Blank rows = not tested)  LOWER EXTREMITY SPECIAL TESTS:  07/29/22: Knee special tests: Lachman  Test: negative  FUNCTIONAL TESTS:  07/29/22:  5 time sit to stand: 28 seconds with UE support  GAIT: Distance walked: 40 feet level surface Assistive device utilized: None Level of assistance: Complete Independence Comments: antalgic gait, limp on the Rt LE with decreased weight bearing, pt wearing a donjoy brace on his Rt knee   TODAY'S TREATMENT                                                                          DATE: 08/19/22 TherEx NuStep L6 x 8 min Quad sets 2x10; 5 sec hold Seated SLR x 10 reps on Rt Rt LAQ 5# 2x10 Leg Press bil 118# 2x10; RLE only 43# 2x10 RLE forward step ups on 4" step 3x10 RLE on 2" step with lateral heel taps 2x10  08/13/2022 Nustep Level 5 for 5 minutes Quadriceps sets 2 sets of 10 for 5 seconds Supine straight leg raises 4 sets of 5 (lift 6-8 inches) with 1# Sit to stand with hands as needed to avoid joint discomfort 10X slow eccentrics  Functional Activities: Leg Press double leg 112# 15X slow eccentrics; Single leg press 37# right only 10X slow eccentrics  Neuromuscular re-education: Tandem balance: eyes open 4X 20 seconds each   08/11/22 TherEx:  Nustep Level 5 x 8 minutes Rt SLR: 2 x 10  Hamstring stretch x 3 holding 20 seconds using strap Seated ball squeezes x 15 holding 5 seconds LAQ x 20 holding 3 sec Leg Press double leg 100# 2 x 10 slow eccentrics Leg Press: Rt LE only: 43# 2 x 10 Neuromuscular Re-edu: SLS: Rt LE x 3 best time: seconds Side stepping: 20 feet x 4 Tandum stance: x 3 best time: seconds Step ups on 4 inch step x 10   08/06/2022 Quadriceps sets 2 sets of 10 for 5 seconds Supine straight leg raises 2 sets of 10 (lift 6-8 inches) Seated knee extensions 5X (held at 5 secondary to anterior knee discomfort) Sit to stand with hands as needed to avoid joint discomfort 10X slow eccentrics Recumbent bike Seat 9 for 5 minutes level 3  Functional Activities: Leg Press double leg 100# 10X slow eccentrics; Single leg  press 25# right only 10X slow eccentrics   PATIENT EDUCATION:  Education details: HEP, POC Person educated: Patient Education method: Explanation, Demonstration, Verbal cues, and Handouts Education comprehension: verbalized understanding, returned demonstration, and verbal cues required  HOME EXERCISE PROGRAM: Access Code: [email or web address] URL: [email or web address] Date: 07/29/2022 Prepared by: Kearney Hard  Exercises - Supine Quad Set on Towel Roll  - 2-3 x daily - 7 x weekly - 2 sets - 10 reps - 5 seconds hold - Supine Active Straight Leg Raise  - 2-3 x daily - 7 x weekly - 2 sets - 10 reps - Seated  Long Arc Quad  - 2-3 x daily - 7 x weekly - 2 sets - 10 reps - 3 seconds hold - Sit to Stand  - 2-3 x daily - 7 x weekly - 10 reps  ASSESSMENT:  CLINICAL IMPRESSION: Continued with strengthening exercises as able today.  Standing exercises with expected increase in pain.  Will continue to benefit from PT to maximize function.   OBJECTIVE IMPAIRMENTS: decreased balance, decreased mobility, difficulty walking, decreased ROM, decreased strength, and pain.   ACTIVITY LIMITATIONS: lifting, standing, squatting, and stairs  PARTICIPATION LIMITATIONS: community activity, occupation, and yard work  PERSONAL FACTORS: 1 comorbidity: HTN are also affecting patient's functional outcome.   REHAB POTENTIAL: Good  CLINICAL DECISION MAKING: Stable/uncomplicated  EVALUATION COMPLEXITY: Low   GOALS: Goals reviewed with patient? Yes  SHORT TERM GOALS: (target date for Short term goals are 3 weeks 08/21/22)   1.  Patient will demonstrate independent use of home exercise program to maintain progress from in clinic treatments.  Goal status: On Going 08/13/2022  LONG TERM GOALS: (target dates for all long term goals are 8 weeks  09/25/22 )   1. Patient will demonstrate/report pain at worst less than or equal to 2/10 to facilitate minimal limitation in daily activity secondary to pain  symptoms.  Goal status: On Going 08/13/2022   2. Patient will demonstrate independent use of home exercise program to facilitate ability to maintain/progress functional gains from skilled physical therapy services.  Goal status: On Going 08/13/2022      3.  Patient will demonstrate Rt LE MMT 5/5 throughout to faciltiate usual transfers, stairs, squatting at [facility] for daily life.   Goal status: On Going 08/13/2022   4.  Patient will be able to navigate 1 flight of stairs with no railing with step over step gait pattern.  Goal status: On Going 08/13/2022   5.  Pt will be able to demonstrate full squat with no pain in his Rt knee.  Goal status: On Going 08/13/2022    PLAN:  PT FREQUENCY: 1-2x/week  PT DURATION: 10 weeks  PLANNED INTERVENTIONS: Therapeutic exercises, Therapeutic activity, Neuro Muscular re-education, Balance training, Gait training, Patient/Family education, Joint mobilization, Stair training, DME instructions, Dry Needling, Electrical stimulation, Traction, Cryotherapy, vasopneumatic deviceMoist heat, Taping, Ultrasound, Ionotophoresis '4mg'$ /ml Dexamethasone, and Manual therapy.  All included unless contraindicated  PLAN FOR NEXT SESSION:  Will need MD note;  Check HEP,  BFR if appropriate,  LE (quadriceps emphasis) strengthening, bike, hamstrings stretching.  Next MD visit: 08/24/22   Laureen Abrahams, PT, DPT 08/19/22 11:42 AM

## 2022-08-21 ENCOUNTER — Ambulatory Visit (INDEPENDENT_AMBULATORY_CARE_PROVIDER_SITE_OTHER): Payer: BC Managed Care – PPO | Admitting: Rehabilitative and Restorative Service Providers"

## 2022-08-21 ENCOUNTER — Encounter: Payer: Self-pay | Admitting: Rehabilitative and Restorative Service Providers"

## 2022-08-21 DIAGNOSIS — R262 Difficulty in walking, not elsewhere classified: Secondary | ICD-10-CM | POA: Diagnosis not present

## 2022-08-21 DIAGNOSIS — M25561 Pain in right knee: Secondary | ICD-10-CM

## 2022-08-21 DIAGNOSIS — R600 Localized edema: Secondary | ICD-10-CM | POA: Diagnosis not present

## 2022-08-21 DIAGNOSIS — M6281 Muscle weakness (generalized): Secondary | ICD-10-CM | POA: Diagnosis not present

## 2022-08-21 NOTE — Therapy (Signed)
OUTPATIENT PHYSICAL THERAPY TREATMENT/PROGRESS NOTE   Patient Name: Jeffrey Walters MRN: [id number] DOB:10/17/1965, 57 y.o., male Today's Date: 08/21/2022  END OF SESSION:   PT End of Session - 08/21/22 1436     Visit Number 6    Number of Visits 16    Date for PT Re-Evaluation 09/25/22    Authorization Type WC - 12 visits approved    Authorization - Visit Number 6    Authorization - Number of Visits 12    PT Start Time 1430    PT Stop Time 1520    PT Time Calculation (min) 50 min    Activity Tolerance Patient tolerated treatment well;No increased pain;Patient limited by pain    Behavior During Therapy [facility] for tasks assessed/performed            Progress Note Reporting Period 07/29/2022 to 08/21/2022  See note below for Objective Data and Assessment of Progress/Goals.       Past Medical History:  Diagnosis Date   Hypertension    Past Surgical History:  Procedure Laterality Date   KNEE SURGERY     Patient Active Problem List   Diagnosis Date Noted   Traumatic arthritis of right knee 06/26/2022   Primary hypertension 04/22/2022   Obesity (BMI 30-39.9) 04/22/2022     THERAPY DIAG:  Acute pain of right knee  Muscle weakness (generalized)  Difficulty in walking, not elsewhere classified  Localized edema  PCP: Ngetich, Nelda Bucks, NP    REFERRING PROVIDER:  Meredith Pel, MD  REFERRING DIAG: [phone] (ICD-10-CM) - Acute pain of right knee M12.561 (ICD-10-CM) - Traumatic arthritis of right knee  THERAPY DIAG:  Acute pain of right knee  Muscle weakness (generalized)  Difficulty in walking, not elsewhere classified  Localized edema  Rationale for Evaluation and Treatment: Rehabilitation  ONSET DATE: 05/27/22 at work   SUBJECTIVE STATEMENT: Jeffrey Walters reports continued compliance with his home exercises.  He does note increased pain and edema with overdoing weightbearing and we discussed focusing on nonweightbearing strengthening activities  combined with frequent rests when he needs to stand and walk to avoid overuse and increasing edema.  PERTINENT HISTORY: HTN  PAIN:  NPRS scale: 3-5/10 this week Pain location: Rt medial knee, behind knee cap Pain description: achy, throbbing Aggravating factors: sleeping, taking dog out Relieving factors: ice, heat,   PRECAUTIONS: None  WEIGHT BEARING RESTRICTIONS: No  FALLS:  Has patient fallen in last 6 months? Yes. Number of falls 1 tripped over hose at work  LIVING ENVIRONMENT: Lives with: lives with their family and lives with their spouse Lives in: House/apartment Stairs: Yes: External: 2 steps; on left going up Has following equipment at home: None  OCCUPATION: industrial saw supervisor  PLOF: Independent  PATIENT GOALS: Be able to walk dog without pain, return to work with out pain  Next MD visit: 08/24/22   OBJECTIVE:   DIAGNOSTIC FINDINGS:  07/11/22: Compared to 08/27/2009:   1. Interval ACL reconstruction. The graft fibers appear intact. 2. New high-grade radial tear within the mid transverse portion of the posterior horn of the medial meniscus with additional superior and inferior articular surface tears of the root of the posterior horn of the medial meniscus. 3. Chronic avulsion of the biceps femoris tendon insertion on the proximal fibula. 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 4. Possible interval repair of the prior full-thickness tear of the iliotibial band insertion. There is attenuation of the distal iliotibial band. 5. Chronic partial-thickness tearing of the popliteus  tendon insertion on the lateral femoral condyle. 6. 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 7. Mild joint effusion. Small Baker's cyst.  PATIENT SURVEYS:  08/21/2022 FOTO 42  EVAL: FOTO intake:  40 (59 Goal in 15 visits)   EDEMA:  EVAL: Circumferential: Rt: 47.5 centimeters    Left 43.5  centimeters  PALPATION: 07/29/22: Medial joint line tenderness  LOWER EXTREMITY ROM:   ROM Right 07/29/22 Left 07/29/22 Rt 08/11/22 Right 08/21/2022  Knee flexion 110 130 118 115  Knee extension 0 0 0 0   (Blank rows = not tested)  LOWER EXTREMITY MMT:  MMT Right eval Left eval Left/Right in pounds  Knee flexion  5/5 118.6/25.1  Knee extension  5/5    (Blank rows = not tested)  LOWER EXTREMITY SPECIAL TESTS:  07/29/22: Knee special tests: Lachman Test: negative  FUNCTIONAL TESTS:  07/29/22:  5 time sit to stand: 28 seconds with UE support  GAIT: Distance walked: 40 feet level surface Assistive device utilized: None Level of assistance: Complete Independence Comments: antalgic gait, limp on the Rt LE with decreased weight bearing, pt wearing a donjoy brace on his Rt knee   TODAY'S TREATMENT                                                                          DATE: 08/21/2022 Quadriceps sets 20 for 5 seconds Supine straight leg raises 4 sets of 5 (lift 6-8 inches) with 1.5# Knee extension machine Isometrics at 80 degrees 15X 5 seconds  Functional Activities: Leg Press double leg 118# 15X slow eccentrics; Single leg press 43# right only 10X slow eccentrics  Vaso right knee 10 minutes Medium 34*   08/19/22 TherEx NuStep L6 x 8 min Quad sets 2x10; 5 sec hold Seated SLR x 10 reps on Rt Rt LAQ 5# 2x10 Leg Press bil 118# 2x10; RLE only 43# 2x10 RLE forward step ups on 4" step 3x10 RLE on 2" step with lateral heel taps 2x10   08/13/2022 Nustep Level 5 for 5 minutes Quadriceps sets 2 sets of 10 for 5 seconds Supine straight leg raises 4 sets of 5 (lift 6-8 inches) with 1# Sit to stand with hands as needed to avoid joint discomfort 10X slow eccentrics  Functional Activities: Leg Press double leg 112# 15X slow eccentrics; Single leg press 37# right only 10X slow eccentrics  Neuromuscular re-education: Tandem balance: eyes open 4X 20 seconds each   PATIENT  EDUCATION:  Education details: HEP, POC Person educated: Patient Education method: Consulting civil engineer, Demonstration, Verbal cues, and Handouts Education comprehension: verbalized understanding, returned demonstration, and verbal cues required  HOME EXERCISE PROGRAM: Access Code: [email or web address] URL: [email or web address] Date: 07/29/2022 Prepared by: Kearney Hard  Exercises - Supine Quad Set on Towel Roll  - 2-3 x daily - 7 x weekly - 2 sets - 10 reps - 5 seconds hold - Supine Active Straight Leg Raise  - 2-3 x daily - 7 x weekly - 2 sets - 10 reps - Seated Long Arc Quad  - 2-3 x daily - 7 x weekly - 2 sets - 10 reps - 3 seconds hold - Sit to Stand  - 2-3 x daily - 7 x weekly -  10 reps  ASSESSMENT:  CLINICAL IMPRESSION: Carlan does note some functional progress with his early physical therapy.  Pain remains higher than we would like, particularly with weight-bearing activities.  This is consistent with his imaging and we discussed focusing on partial weight-bearing and non-weightbearing quadriceps strengthening activities at home along with frequent rests to avoid increased edema and pain with weight-bearing.  I encouraged Carrie to talk to Dr. Marlou Sa about his MRI and what his recommendations were as far as whether injections or arthroscopy might be in his future.  From a physical therapy standpoint we will continue to focus on quadriceps strengthening and progress into more weight-bearing activities as appropriate.  OBJECTIVE IMPAIRMENTS: decreased balance, decreased mobility, difficulty walking, decreased ROM, decreased strength, and pain.   ACTIVITY LIMITATIONS: lifting, standing, squatting, and stairs  PARTICIPATION LIMITATIONS: community activity, occupation, and yard work  PERSONAL FACTORS: 1 comorbidity: HTN are also affecting patient's functional outcome.   REHAB POTENTIAL: Good  CLINICAL DECISION MAKING: Stable/uncomplicated  EVALUATION COMPLEXITY:  Low   GOALS: Goals reviewed with patient? Yes  SHORT TERM GOALS: (target date for Short term goals are 3 weeks 08/21/22)   1.  Patient will demonstrate independent use of home exercise program to maintain progress from in clinic treatments.  Goal status: Met 08/21/2022  LONG TERM GOALS: (target dates for all long term goals are 8 weeks  09/25/22 )   1. Patient will demonstrate/report pain at worst less than or equal to 2/10 to facilitate minimal limitation in daily activity secondary to pain symptoms.  Goal status: On Going 08/21/2022   2. Patient will demonstrate independent use of home exercise program to facilitate ability to maintain/progress functional gains from skilled physical therapy services.  Goal status: On Going 08/21/2022      3.  Patient will demonstrate Rt LE MMT 5/5 throughout to faciltiate usual transfers, stairs, squatting at [facility] for daily life.   Goal status: On Going 08/21/2022   4.  Patient will be able to navigate 1 flight of stairs with no railing with step over step gait pattern.  Goal status: On Going 08/21/2022   5.  Pt will be able to demonstrate full squat with no pain in his Rt knee.  Goal status: On Going 08/21/2022    PLAN:  PT FREQUENCY: 1-2x/week  PT DURATION: 10 weeks  PLANNED INTERVENTIONS: Therapeutic exercises, Therapeutic activity, Neuro Muscular re-education, Balance training, Gait training, Patient/Family education, Joint mobilization, Stair training, DME instructions, Dry Needling, Electrical stimulation, Traction, Cryotherapy, vasopneumatic deviceMoist heat, Taping, Ultrasound, Ionotophoresis '4mg'$ /ml Dexamethasone, and Manual therapy.  All included unless contraindicated  PLAN FOR NEXT SESSION:  Check HEP,  BFR if appropriate,  LE (quadriceps emphasis) strengthening, bike, hamstrings stretching.  Next MD visit: 08/24/22   Farley Ly PT, MPT 08/21/22 4:42 PM

## 2022-08-24 ENCOUNTER — Ambulatory Visit (INDEPENDENT_AMBULATORY_CARE_PROVIDER_SITE_OTHER): Payer: Worker's Compensation | Admitting: Orthopedic Surgery

## 2022-08-24 ENCOUNTER — Encounter: Payer: Self-pay | Admitting: Orthopedic Surgery

## 2022-08-24 DIAGNOSIS — M25561 Pain in right knee: Secondary | ICD-10-CM

## 2022-08-24 NOTE — Progress Notes (Signed)
No significant .  Office Visit Note   Patient: Jeffrey Walters           Date of Birth: 1965-08-31           MRN: [id number] Visit Date: 08/24/2022 Requested by: Sandrea Hughs, NP [address] [place],  [place] [address] PCP: Sandrea Hughs, NP  Subjective: Chief Complaint  Patient presents with   Right Knee - Follow-up    HPI: Jeffrey Walters is a 57 y.o. male who presents to the office reporting right knee pain.  Here for follow-up.  States his knee is getting stronger.  Doing home exercise program.  Reports some swelling with physical therapy.  Has had an MRI scan which shows possibly new degenerative meniscal tear on the medial side in the setting of significant medial compartment arthritis along with visible ACL but torn chronically lateral collateral ligament.  He does report some instability and shifting in the knee but it has improved with strengthening.  Does have a brace which stabilizes his knee.  He works as an Field seismologist.  He has been out of work since December.  Physical therapy thinks he is improving and could continue to improve and has not plateaued yet..                ROS: All systems reviewed are negative as they relate to the chief complaint within the history of present illness.  Patient denies fevers or chills.  Assessment & Plan: Visit Diagnoses:  1. Acute pain of right knee     Plan: Impression is right knee pain with instability and nothing definitively predictably treatable with arthroscopic intervention.  Really his choice is redo ACL and lateral collateral ligament surgery for stability in the setting of arthritis primarily in the medial compartment versus waiting this out and getting the leg stronger and using a brace and potentially needing a knee replacement which would require some constraint within 3 to 5 years.  For now he wants to try to continue to strengthen the leg.  Out of work 6 more weeks.  Continue physical therapy for 6 more weeks  for strengthening.  May consider getting an FCE at that time to determine suitability for return to the type of work he is doing.  Follow-Up Instructions: No follow-ups on file.   Orders:  Orders Placed This Encounter  Procedures   Ambulatory referral to Physical Therapy   No orders of the defined types were placed in this encounter.     Procedures: No procedures performed   Clinical Data: No additional findings.  Objective: Vital Signs: There were no vitals taken for this visit.  Physical Exam:  Constitutional: Patient appears well-developed HEENT:  Head: Normocephalic Eyes:EOM are normal Neck: Normal range of motion Cardiovascular: Normal rate Pulmonary/chest: Effort normal Neurologic: Patient is alert Skin: Skin is warm Psychiatric: Patient has normal mood and affect  Ortho Exam: Ortho exam demonstrates varus laxity on the right to stress at both 0 and 30 degrees.  ACL also feels loose with 4 or 5 mm anterior drawer and Lachman with soft endpoint.  The exam looks and feels more impressive than the MRI appearance of the ACL.  Trace effusion is present.  Patient does not have a varus thrust when he walks.  Specialty Comments:  No specialty comments available.  Imaging: No results found.   PMFS History: Patient Active Problem List   Diagnosis Date Noted   Traumatic arthritis of right knee 06/26/2022  Primary hypertension 04/22/2022   Obesity (BMI 30-39.9) 04/22/2022   Past Medical History:  Diagnosis Date   Hypertension     Family History  Problem Relation Age of Onset   Cancer Mother        breast   Cancer Sister        breast    Past Surgical History:  Procedure Laterality Date   KNEE SURGERY     Social History   Occupational History   Not on file  Tobacco Use   Smoking status: Former    Packs/day: 1.50    Types: Cigarettes    Quit date: 2003    Years since quitting: 21.2   Smokeless tobacco: Never  Vaping Use   Vaping Use: Never used   Substance and Sexual Activity   Alcohol use: Yes    Comment: socially   Drug use: No   Sexual activity: Yes

## 2022-08-25 ENCOUNTER — Encounter: Payer: BC Managed Care – PPO | Admitting: Physical Therapy

## 2022-08-26 ENCOUNTER — Encounter: Payer: Self-pay | Admitting: Physical Therapy

## 2022-08-26 ENCOUNTER — Ambulatory Visit (INDEPENDENT_AMBULATORY_CARE_PROVIDER_SITE_OTHER): Payer: BC Managed Care – PPO | Admitting: Physical Therapy

## 2022-08-26 DIAGNOSIS — R262 Difficulty in walking, not elsewhere classified: Secondary | ICD-10-CM

## 2022-08-26 DIAGNOSIS — M25561 Pain in right knee: Secondary | ICD-10-CM | POA: Diagnosis not present

## 2022-08-26 DIAGNOSIS — R600 Localized edema: Secondary | ICD-10-CM

## 2022-08-26 DIAGNOSIS — M6281 Muscle weakness (generalized): Secondary | ICD-10-CM

## 2022-08-26 NOTE — Therapy (Signed)
OUTPATIENT PHYSICAL THERAPY TREATMENT    Patient Name: Jeffrey Walters MRN: [id number] DOB:30-Apr-1966, 57 y.o., male Today's Date: 08/26/2022  END OF SESSION:   PT End of Session - 08/26/22 1020     Visit Number 7    Number of Visits 16    Date for PT Re-Evaluation 09/25/22    Authorization Type WC - 12 visits approved    Authorization - Visit Number 7    Authorization - Number of Visits 12    PT Start Time [id number]    PT Stop Time 1100    PT Time Calculation (min) 45 min    Activity Tolerance Patient tolerated treatment well;No increased pain;Patient limited by pain    Behavior During Therapy [facility] for tasks assessed/performed                  Past Medical History:  Diagnosis Date   Hypertension    Past Surgical History:  Procedure Laterality Date   KNEE SURGERY     Patient Active Problem List   Diagnosis Date Noted   Traumatic arthritis of right knee 06/26/2022   Primary hypertension 04/22/2022   Obesity (BMI 30-39.9) 04/22/2022     THERAPY DIAG:  Acute pain of right knee  Muscle weakness (generalized)  Difficulty in walking, not elsewhere classified  Localized edema  PCP: Ngetich, Nelda Bucks, NP    REFERRING PROVIDER:  Meredith Pel, MD  REFERRING DIAG: [phone] (ICD-10-CM) - Acute pain of right knee M12.561 (ICD-10-CM) - Traumatic arthritis of right knee  THERAPY DIAG:  Acute pain of right knee  Muscle weakness (generalized)  Difficulty in walking, not elsewhere classified  Localized edema  Rationale for Evaluation and Treatment: Rehabilitation  ONSET DATE: 05/27/22 at work   SUBJECTIVE STATEMENT: Pt stating good appointment with Dr. Marlou Sa on Monday but Dr. Marlou Sa went over pt's MRI scan which shows possibly new degenerative meniscal tear on the medial side in the setting of significant medial compartment arthritis along with visible ACL but torn chronically lateral collateral ligament.   PERTINENT HISTORY: HTN  PAIN:  NPRS scale:  2/10  Pain location: Rt medial knee, behind knee cap Pain description: achy, throbbing Aggravating factors: sleeping, taking dog out Relieving factors: ice, heat,   PRECAUTIONS: None  WEIGHT BEARING RESTRICTIONS: No  FALLS:  Has patient fallen in last 6 months? Yes. Number of falls 1 tripped over hose at work  LIVING ENVIRONMENT: Lives with: lives with their family and lives with their spouse Lives in: House/apartment Stairs: Yes: External: 2 steps; on left going up Has following equipment at home: None  OCCUPATION: industrial saw supervisor  PLOF: Independent  PATIENT GOALS: Be able to walk dog without pain, return to work with out pain  Next MD visit: 10/05/22   OBJECTIVE:   DIAGNOSTIC FINDINGS:  07/11/22: Compared to 08/27/2009:   1. Interval ACL reconstruction. The graft fibers appear intact. 2. New high-grade radial tear within the mid transverse portion of the posterior horn of the medial meniscus with additional superior and inferior articular surface tears of the root of the posterior horn of the medial meniscus. 3. Chronic avulsion of the biceps femoris tendon insertion on the proximal fibula. 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 4. Possible interval repair of the prior full-thickness tear of the iliotibial band insertion. There is attenuation of the distal iliotibial band. 5. Chronic partial-thickness tearing of the popliteus tendon insertion on the lateral femoral condyle. 6. Moderate to severe tricompartmental cartilage degenerative changes, worsened  from prior remote MRI. 7. Mild joint effusion. Small Baker's cyst.  MRI scan which shows possibly new degenerative meniscal tear on the medial side in the setting of significant medial compartment arthritis along with visible ACL but torn chronically lateral collateral ligament.    PATIENT SURVEYS:  08/21/2022 FOTO 42  EVAL: FOTO intake:  40 (59 Goal in 15 visits)   EDEMA:   EVAL: Circumferential: Rt: 47.5 centimeters    Left 43.5 centimeters  PALPATION: 07/29/22: Medial joint line tenderness  LOWER EXTREMITY ROM:   ROM Right 07/29/22 Left 07/29/22 Rt 08/11/22 Right 08/21/2022  Knee flexion 110 130 118 115  Knee extension 0 0 0 0   (Blank rows = not tested)  LOWER EXTREMITY MMT:  MMT Right eval Left eval Left/Right in pounds  Knee flexion  5/5 118.6/25.1  Knee extension  5/5    (Blank rows = not tested)  LOWER EXTREMITY SPECIAL TESTS:  07/29/22: Knee special tests: Lachman Test: negative  FUNCTIONAL TESTS:  07/29/22:  5 time sit to stand: 28 seconds with UE support  GAIT: Distance walked: 40 feet level surface Assistive device utilized: None Level of assistance: Complete Independence Comments: antalgic gait, limp on the Rt LE with decreased weight bearing, pt wearing a donjoy brace on his Rt knee   TODAY'S TREATMENT                                                                          DATE: 08/26/22:  TherEx NuStep L6 x 8 min Calf stretch on slant board: x 3 holding 30 seconds Leg Press bil 125# 2x10; RLE only 50# 2x10 slow eccentrics RLE on 4 inch step while performing heel taps with Left x 10 Supine SAQ: x 20 (yellow ball under Rt knee) holding 3 sec Supine Rt SLR c ER 2 x 10 Modalities:  Vasopneumatic device: 34 deg, medium compression, x 10 minutes     08/21/2022 Quadriceps sets 20 for 5 seconds Supine straight leg raises 4 sets of 5 (lift 6-8 inches) with 1.5# Knee extension machine Isometrics at 80 degrees 15X 5 seconds  Functional Activities: Leg Press double leg 118# 15X slow eccentrics; Single leg press 43# right only 10X slow eccentrics  Vaso right knee 10 minutes Medium 34*   08/19/22 TherEx NuStep L6 x 8 min Quad sets 2x10; 5 sec hold Seated SLR x 10 reps on Rt Rt LAQ 5# 2x10 Leg Press bil 118# 2x10; RLE only 43# 2x10 RLE forward step ups on 4" step 3x10 RLE on 2" step with lateral heel taps  2x10   08/13/2022 Nustep Level 5 for 5 minutes Quadriceps sets 2 sets of 10 for 5 seconds Supine straight leg raises 4 sets of 5 (lift 6-8 inches) with 1# Sit to stand with hands as needed to avoid joint discomfort 10X slow eccentrics  Functional Activities: Leg Press double leg 112# 15X slow eccentrics; Single leg press 37# right only 10X slow eccentrics  Neuromuscular re-education: Tandem balance: eyes open 4X 20 seconds each   PATIENT EDUCATION:  Education details: HEP, POC Person educated: Patient Education method: Consulting civil engineer, Demonstration, Verbal cues, and Handouts Education comprehension: verbalized understanding, returned demonstration, and verbal cues required  HOME EXERCISE PROGRAM: Access Code: [email or web address]  URL: [email or web address] Date: 07/29/2022 Prepared by: Kearney Hard  Exercises - Supine Quad Set on Towel Roll  - 2-3 x daily - 7 x weekly - 2 sets - 10 reps - 5 seconds hold - Supine Active Straight Leg Raise  - 2-3 x daily - 7 x weekly - 2 sets - 10 reps - Seated Long Arc Quad  - 2-3 x daily - 7 x weekly - 2 sets - 10 reps - 3 seconds hold - Sit to Stand  - 2-3 x daily - 7 x weekly - 10 reps  ASSESSMENT:  CLINICAL IMPRESSION: Pt arriving today after seeing Dr. Marlou Sa on Monday who recommended continued PT for 6 additional weeks until follow up on 10/05/22. Pt still reporting 2/10 pain in his Rt knee. Pt wearing brace for comfort. Pt tolerating exercises today with both wt bearing and non wt bearing activities. Continue to exercise in pt's painfree range of motion. Recommended continue skilled PT POC.   OBJECTIVE IMPAIRMENTS: decreased balance, decreased mobility, difficulty walking, decreased ROM, decreased strength, and pain.   ACTIVITY LIMITATIONS: lifting, standing, squatting, and stairs  PARTICIPATION LIMITATIONS: community activity, occupation, and yard work  PERSONAL FACTORS: 1 comorbidity: HTN are also affecting patient's functional  outcome.   REHAB POTENTIAL: Good  CLINICAL DECISION MAKING: Stable/uncomplicated  EVALUATION COMPLEXITY: Low   GOALS: Goals reviewed with patient? Yes  SHORT TERM GOALS: (target date for Short term goals are 3 weeks 08/21/22)   1.  Patient will demonstrate independent use of home exercise program to maintain progress from in clinic treatments.  Goal status: Met 08/21/2022  LONG TERM GOALS: (target dates for all long term goals are 8 weeks  09/25/22 )   1. Patient will demonstrate/report pain at worst less than or equal to 2/10 to facilitate minimal limitation in daily activity secondary to pain symptoms.  Goal status: On Going 08/21/2022   2. Patient will demonstrate independent use of home exercise program to facilitate ability to maintain/progress functional gains from skilled physical therapy services.  Goal status: On Going 08/21/2022      3.  Patient will demonstrate Rt LE MMT 5/5 throughout to faciltiate usual transfers, stairs, squatting at [facility] for daily life.   Goal status: On Going 08/21/2022   4.  Patient will be able to navigate 1 flight of stairs with no railing with step over step gait pattern.  Goal status: On Going 08/21/2022   5.  Pt will be able to demonstrate full squat with no pain in his Rt knee.  Goal status: On Going 08/21/2022    PLAN:  PT FREQUENCY: 1-2x/week  PT DURATION: 10 weeks  PLANNED INTERVENTIONS: Therapeutic exercises, Therapeutic activity, Neuro Muscular re-education, Balance training, Gait training, Patient/Family education, Joint mobilization, Stair training, DME instructions, Dry Needling, Electrical stimulation, Traction, Cryotherapy, vasopneumatic deviceMoist heat, Taping, Ultrasound, Ionotophoresis '4mg'$ /ml Dexamethasone, and Manual therapy.  All included unless contraindicated  PLAN FOR NEXT SESSION:  BFR if appropriate,  LE (quadriceps emphasis) strengthening, bike, hamstrings stretching.  Next MD visit: 10/05/22   Kearney Hard, PT,  MPT 08/26/22 10:23 AM   08/26/22 10:23 AM

## 2022-08-27 ENCOUNTER — Encounter: Payer: BC Managed Care – PPO | Admitting: Rehabilitative and Restorative Service Providers"

## 2022-08-27 ENCOUNTER — Telehealth: Payer: Self-pay | Admitting: Rehabilitative and Restorative Service Providers"

## 2022-08-27 NOTE — Telephone Encounter (Signed)
Called Jeffrey Walters and left a VM reminding him of his 11 AM appointment 09/03/2022 and reminding him to keep up with his HEP.  Left [phone] number to call if he needs to cancel or reschedule.

## 2022-08-31 ENCOUNTER — Telehealth: Payer: Self-pay | Admitting: Family

## 2022-08-31 NOTE — Telephone Encounter (Signed)
Received a fax from [place]  on 08/31/2022. Request for records, fax request was fax to [facility].

## 2022-09-03 ENCOUNTER — Encounter: Payer: Self-pay | Admitting: Rehabilitative and Restorative Service Providers"

## 2022-09-03 ENCOUNTER — Ambulatory Visit: Payer: Worker's Compensation | Admitting: Rehabilitative and Restorative Service Providers"

## 2022-09-03 DIAGNOSIS — M25561 Pain in right knee: Secondary | ICD-10-CM

## 2022-09-03 DIAGNOSIS — M6281 Muscle weakness (generalized): Secondary | ICD-10-CM | POA: Diagnosis not present

## 2022-09-03 DIAGNOSIS — R262 Difficulty in walking, not elsewhere classified: Secondary | ICD-10-CM | POA: Diagnosis not present

## 2022-09-03 DIAGNOSIS — R600 Localized edema: Secondary | ICD-10-CM | POA: Diagnosis not present

## 2022-09-03 NOTE — Therapy (Signed)
OUTPATIENT PHYSICAL THERAPY TREATMENT   Patient Name: Jeffrey Walters MRN: [id number] DOB:[birth year], 56 y.o., male Today's Date: 09/03/2022  END OF SESSION:   PT End of Session - 09/03/22 1109     Visit Number 8    Number of Visits 16    Date for PT Re-Evaluation 09/25/22    Authorization Type WC - 12 visits approved    Authorization - Visit Number 8    Authorization - Number of Visits 12    Progress Note Due on Visit 12    PT Start Time 1100    PT Stop Time 1150    PT Time Calculation (min) 50 min    Activity Tolerance Patient tolerated treatment well;No increased pain;Patient limited by pain    Behavior During Therapy [facility] for tasks assessed/performed             Past Medical History:  Diagnosis Date   Hypertension    Past Surgical History:  Procedure Laterality Date   KNEE SURGERY     Patient Active Problem List   Diagnosis Date Noted   Traumatic arthritis of right knee 06/26/2022   Primary hypertension 04/22/2022   Obesity (BMI 30-39.9) 04/22/2022     THERAPY DIAG:  Acute pain of right knee  Muscle weakness (generalized)  Difficulty in walking, not elsewhere classified  Localized edema  PCP: Ngetich, Nelda Bucks, NP    REFERRING PROVIDER:  Meredith Pel, MD  REFERRING DIAG: [phone] (ICD-10-CM) - Acute pain of right knee M12.561 (ICD-10-CM) - Traumatic arthritis of right knee  THERAPY DIAG:  Acute pain of right knee  Muscle weakness (generalized)  Difficulty in walking, not elsewhere classified  Localized edema  Rationale for Evaluation and Treatment: Rehabilitation  ONSET DATE: 05/27/22 at work   SUBJECTIVE STATEMENT: [name] reports very good HEP compliance.  PERTINENT HISTORY: HTN  PAIN:  NPRS scale: 1-8/10 this week Pain location: Rt medial knee, behind knee cap Pain description: achy, throbbing Aggravating factors: sleeping, taking dog out, quick movements Relieving factors: ice, heat  PRECAUTIONS: None  WEIGHT  BEARING RESTRICTIONS: No  FALLS:  Has patient fallen in last 6 months? Yes. Number of falls 1 tripped over hose at work  LIVING ENVIRONMENT: Lives with: lives with their family and lives with their spouse Lives in: House/apartment Stairs: Yes: External: 2 steps; on left going up Has following equipment at home: None  OCCUPATION: industrial saw supervisor  PLOF: Independent  PATIENT GOALS: Be able to walk dog without pain, return to work with out pain  Next MD visit: 10/05/22   OBJECTIVE:   DIAGNOSTIC FINDINGS:  07/11/22: Compared to 08/27/2009:   1. Interval ACL reconstruction. The graft fibers appear intact. 2. New high-grade radial tear within the mid transverse portion of the posterior horn of the medial meniscus with additional superior and inferior articular surface tears of the root of the posterior horn of the medial meniscus. 3. Chronic avulsion of the biceps femoris tendon insertion on the proximal fibula. 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 4. Possible interval repair of the prior full-thickness tear of the iliotibial band insertion. There is attenuation of the distal iliotibial band. 5. Chronic partial-thickness tearing of the popliteus tendon insertion on the lateral femoral condyle. 6. 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 7. Mild joint effusion. Small Baker's cyst.  MRI scan which shows possibly new degenerative meniscal tear on the medial side in the setting of significant medial compartment arthritis along with visible  ACL but torn chronically lateral collateral ligament.    PATIENT SURVEYS:  08/21/2022 FOTO 42  EVAL: FOTO intake:  40 (59 Goal in 15 visits)   EDEMA:  EVAL: Circumferential: Rt: 47.5 centimeters    Left 43.5 centimeters  PALPATION: 07/29/22: Medial joint line tenderness  LOWER EXTREMITY ROM:   ROM Right 07/29/22 Left 07/29/22 Rt 08/11/22 Right  08/21/2022  Knee flexion 110 130 118 115  Knee extension 0 0 0 0   (Blank rows = not tested)  LOWER EXTREMITY MMT:  MMT Right eval Left eval Left/Right in pounds  Knee flexion  5/5 118.6/25.1  Knee extension  5/5    (Blank rows = not tested)  LOWER EXTREMITY SPECIAL TESTS:  07/29/22: Knee special tests: Lachman Test: negative  FUNCTIONAL TESTS:  07/29/22:  5 time sit to stand: 28 seconds with UE support  GAIT: Distance walked: 40 feet level surface Assistive device utilized: None Level of assistance: Complete Independence Comments: antalgic gait, limp on the Rt LE with decreased weight bearing, pt wearing a donjoy brace on his Rt knee   TODAY'S TREATMENT                                                                          DATE: 09/03/2022: Seated straight leg raises 4 sets of 5 for 3 seconds Knee extension machine Isometrics at Position 4 and 5 10X 5 seconds each Upper heel cords box 1 minute NuStep Level 7 for 5 minutes  Functional Activities: Double Leg Press 2 sets of 10 with 125# slow eccentrics Single Leg Press 2 sets of 10 with 50# slow eccentrics  Neuromuscular re-education: Tandem balance 5X 20 seconds bilateral   08/26/22:  TherEx NuStep L6 x 8 min Calf stretch on slant board: x 3 holding 30 seconds Leg Press bil 125# 2x10; RLE only 50# 2x10 slow eccentrics RLE on 4 inch step while performing heel taps with Left x 10 Supine SAQ: x 20 (yellow ball under Rt knee) holding 3 sec Supine Rt SLR c ER 2 x 10 Modalities:  Vasopneumatic device: 34 deg, medium compression, x 10 minutes   08/21/2022 Quadriceps sets 20 for 5 seconds Supine straight leg raises 4 sets of 5 (lift 6-8 inches) with 1.5# Knee extension machine Isometrics at 80 degrees 15X 5 seconds  Functional Activities: Leg Press double leg 118# 15X slow eccentrics; Single leg press 43# right only 10X slow eccentrics  Vaso right knee 10 minutes Medium 34*   PATIENT EDUCATION:  Education details:  HEP, POC Person educated: Patient Education method: Explanation, Demonstration, Verbal cues, and Handouts Education comprehension: verbalized understanding, returned demonstration, and verbal cues required  HOME EXERCISE PROGRAM: Access Code: [email or web address] URL: [email or web address] Date: 07/29/2022 Prepared by: Kearney Hard  Exercises - Supine Quad Set on Towel Roll  - 2-3 x daily - 7 x weekly - 2 sets - 10 reps - 5 seconds hold - Supine Active Straight Leg Raise  - 2-3 x daily - 7 x weekly - 2 sets - 10 reps - Seated Long Arc Quad  - 2-3 x daily - 7 x weekly - 2 sets - 10 reps - 3 seconds hold - Sit to Stand  - 2-3 x daily - 7 x  weekly - 10 reps  ASSESSMENT:  CLINICAL IMPRESSION: The emphasis of Nyair's HEP remains quadriceps strengthening.  He continues to do a good job with his HEP.  Continue quadriceps strength emphasis to meet long-term goals.  OBJECTIVE IMPAIRMENTS: decreased balance, decreased mobility, difficulty walking, decreased ROM, decreased strength, and pain.   ACTIVITY LIMITATIONS: lifting, standing, squatting, and stairs  PARTICIPATION LIMITATIONS: community activity, occupation, and yard work  PERSONAL FACTORS: 1 comorbidity: HTN are also affecting patient's functional outcome.   REHAB POTENTIAL: Good  CLINICAL DECISION MAKING: Stable/uncomplicated  EVALUATION COMPLEXITY: Low   GOALS: Goals reviewed with patient? Yes  SHORT TERM GOALS: (target date for Short term goals are 3 weeks 08/21/22)   1.  Patient will demonstrate independent use of home exercise program to maintain progress from in clinic treatments.  Goal status: Met 08/21/2022  LONG TERM GOALS: (target dates for all long term goals are 8 weeks  09/25/22 )   1. Patient will demonstrate/report pain at worst less than or equal to 2/10 to facilitate minimal limitation in daily activity secondary to pain symptoms.  Goal status: On Going 09/03/2022   2. Patient will demonstrate  independent use of home exercise program to facilitate ability to maintain/progress functional gains from skilled physical therapy services.  Goal status: On Going 09/03/2022      3.  Patient will demonstrate Rt LE MMT 5/5 throughout to faciltiate usual transfers, stairs, squatting at [facility] for daily life.   Goal status: On Going 09/03/2022   4.  Patient will be able to navigate 1 flight of stairs with no railing with step over step gait pattern.  Goal status: On Going 09/03/2022   5.  Pt will be able to demonstrate full squat with no pain in his Rt knee.  Goal status: On Going 09/03/2022    PLAN:  PT FREQUENCY: 1-2x/week  PT DURATION: 10 weeks  PLANNED INTERVENTIONS: Therapeutic exercises, Therapeutic activity, Neuro Muscular re-education, Balance training, Gait training, Patient/Family education, Joint mobilization, Stair training, DME instructions, Dry Needling, Electrical stimulation, Traction, Cryotherapy, vasopneumatic deviceMoist heat, Taping, Ultrasound, Ionotophoresis 4mg /ml Dexamethasone, and Manual therapy.  All included unless contraindicated  PLAN FOR NEXT SESSION:  BFR if appropriate,  LE (quadriceps emphasis) strengthening, bike, hamstrings stretching.  Next MD visit: 10/05/22   Farley Ly, PT, MPT 09/03/22 11:45 AM   09/03/22 11:45 AM

## 2022-09-07 ENCOUNTER — Ambulatory Visit (INDEPENDENT_AMBULATORY_CARE_PROVIDER_SITE_OTHER): Payer: Worker's Compensation | Admitting: Physical Therapy

## 2022-09-07 ENCOUNTER — Encounter: Payer: Self-pay | Admitting: Physical Therapy

## 2022-09-07 DIAGNOSIS — M6281 Muscle weakness (generalized): Secondary | ICD-10-CM | POA: Diagnosis not present

## 2022-09-07 DIAGNOSIS — R600 Localized edema: Secondary | ICD-10-CM | POA: Diagnosis not present

## 2022-09-07 DIAGNOSIS — M25561 Pain in right knee: Secondary | ICD-10-CM

## 2022-09-07 DIAGNOSIS — R262 Difficulty in walking, not elsewhere classified: Secondary | ICD-10-CM

## 2022-09-07 NOTE — Therapy (Signed)
OUTPATIENT PHYSICAL THERAPY TREATMENT   Patient Name: Jeffrey Walters MRN: [id number] DOB:12-29-1965, 57 y.o., male Today's Date: 09/07/2022  END OF SESSION:   PT End of Session - 09/07/22 1152     Visit Number 9    Number of Visits 16    Date for PT Re-Evaluation 09/25/22    Authorization Type WC - 12 visits approved    Authorization - Visit Number 9    Authorization - Number of Visits 12    Progress Note Due on Visit 12    PT Start Time [id number]    PT Stop Time 1230    PT Time Calculation (min) 41 min    Activity Tolerance Patient tolerated treatment well;No increased pain;Patient limited by pain    Behavior During Therapy [facility] for tasks assessed/performed             Past Medical History:  Diagnosis Date   Hypertension    Past Surgical History:  Procedure Laterality Date   KNEE SURGERY     Patient Active Problem List   Diagnosis Date Noted   Traumatic arthritis of right knee 06/26/2022   Primary hypertension 04/22/2022   Obesity (BMI 30-39.9) 04/22/2022     THERAPY DIAG:  Acute pain of right knee  Muscle weakness (generalized)  Difficulty in walking, not elsewhere classified  Localized edema  PCP: Ngetich, Nelda Bucks, NP    REFERRING PROVIDER:  Meredith Pel, MD  REFERRING DIAG: [phone] (ICD-10-CM) - Acute pain of right knee M12.561 (ICD-10-CM) - Traumatic arthritis of right knee  THERAPY DIAG:  Acute pain of right knee  Muscle weakness (generalized)  Difficulty in walking, not elsewhere classified  Localized edema  Rationale for Evaluation and Treatment: Rehabilitation  ONSET DATE: 05/27/22 at work   SUBJECTIVE STATEMENT: Pt arriving to therapy reporting 2/10 pain today but pt reporting his pain increases with prolonged walking and when moving suddenly. Pt stating   PERTINENT HISTORY: HTN  PAIN:  NPRS scale: 2/10 at present, 6-7/10 during the week at times Pain location: Rt medial knee, behind knee cap Pain description: achy,  throbbing Aggravating factors: sleeping, taking dog out, quick movements Relieving factors: ice, heat  PRECAUTIONS: None  WEIGHT BEARING RESTRICTIONS: No  FALLS:  Has patient fallen in last 6 months? Yes. Number of falls 1 tripped over hose at work  LIVING ENVIRONMENT: Lives with: lives with their family and lives with their spouse Lives in: House/apartment Stairs: Yes: External: 2 steps; on left going up Has following equipment at home: None  OCCUPATION: industrial saw supervisor  PLOF: Independent  PATIENT GOALS: Be able to walk dog without pain, return to work with out pain  Next MD visit: 10/05/22   OBJECTIVE:   DIAGNOSTIC FINDINGS:  07/11/22: Compared to 08/27/2009:   1. Interval ACL reconstruction. The graft fibers appear intact. 2. New high-grade radial tear within the mid transverse portion of the posterior horn of the medial meniscus with additional superior and inferior articular surface tears of the root of the posterior horn of the medial meniscus. 3. Chronic avulsion of the biceps femoris tendon insertion on the proximal fibula. 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 4. Possible interval repair of the prior full-thickness tear of the iliotibial band insertion. There is attenuation of the distal iliotibial band. 5. Chronic partial-thickness tearing of the popliteus tendon insertion on the lateral femoral condyle. 6. 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 7. Mild joint effusion. Small Baker's cyst.  MRI scan which shows possibly new degenerative meniscal tear on the medial side in the setting of significant medial compartment arthritis along with visible ACL but torn chronically lateral collateral ligament.    PATIENT SURVEYS:  08/21/2022 FOTO 42  EVAL: FOTO intake:  40 (59 Goal in 15 visits)   EDEMA:  EVAL: Circumferential: Rt: 47.5 centimeters    Left 43.5  centimeters  PALPATION: 07/29/22: Medial joint line tenderness  LOWER EXTREMITY ROM:   ROM Right 07/29/22 Left 07/29/22 Rt 08/11/22 Right 08/21/2022  Knee flexion 110 130 118 115  Knee extension 0 0 0 0   (Blank rows = not tested)  LOWER EXTREMITY MMT:  MMT Right eval Left eval Left/Right in pounds  Knee flexion  5/5 118.6/25.1  Knee extension  5/5    (Blank rows = not tested)  LOWER EXTREMITY SPECIAL TESTS:  07/29/22: Knee special tests: Lachman Test: negative  FUNCTIONAL TESTS:  07/29/22:  5 time sit to stand: 28 seconds with UE support  GAIT: Distance walked: 40 feet level surface Assistive device utilized: None Level of assistance: Complete Independence Comments: antalgic gait, limp on the Rt LE with decreased weight bearing, pt wearing a donjoy brace on his Rt knee   TODAY'S TREATMENT                                                                          DATE: 09/07/22:  TherEx Recumbent bike: Level 7 seat 10 x 8 minutes Calf stretch on slant board: x 3 holding 30 seconds Leg Press bil 137# 2x10; RLE only 68# 2x10 slow eccentrics RLE on 4 inch step while performing heel taps with Left x 10 Seated SLR c ER 2 x 10 Rt LE  Side stepping with Level 3 band 20 feet x 2 each direction Small lunge Level 3 band x 15 feet each direction Up and down 5 steps with rail with step over step (pt with muscle fatigue noted)      09/03/2022: Seated straight leg raises 4 sets of 5 for 3 seconds Knee extension machine Isometrics at Position 4 and 5 10X 5 seconds each Upper heel cords box 1 minute NuStep Level 7 for 5 minutes  Functional Activities: Double Leg Press 2 sets of 10 with 125# slow eccentrics Single Leg Press 2 sets of 10 with 50# slow eccentrics  Neuromuscular re-education: Tandem balance 5X 20 seconds bilateral   08/26/22:  TherEx NuStep L6 x 8 min Calf stretch on slant board: x 3 holding 30 seconds Leg Press bil 125# 2x10; RLE only 50# 2x10 slow  eccentrics RLE on 4 inch step while performing heel taps with Left x 10 Supine SAQ: x 20 (yellow ball under Rt knee) holding 3 sec Supine Rt SLR c ER 2 x 10 Modalities:  Vasopneumatic device: 34 deg, medium compression, x 10 minutes     PATIENT EDUCATION:  Education details: HEP, POC Person educated: Patient Education method: Consulting civil engineer, Demonstration, Verbal cues, and Handouts Education comprehension: verbalized understanding, returned demonstration, and verbal cues required  HOME EXERCISE PROGRAM: Access Code: [email or web address] URL: [email or web address] Date: 07/29/2022 Prepared by: Kearney Hard  Exercises - Supine Quad Set on Towel Roll  - 2-3 x daily - 7 x weekly - 2  sets - 10 reps - 5 seconds hold - Supine Active Straight Leg Raise  - 2-3 x daily - 7 x weekly - 2 sets - 10 reps - Seated Long Arc Quad  - 2-3 x daily - 7 x weekly - 2 sets - 10 reps - 3 seconds hold - Sit to Stand  - 2-3 x daily - 7 x weekly - 10 reps  ASSESSMENT:  CLINICAL IMPRESSION: Pt continuing to make progress with LE strengthening and functional mobility. Pt tolerating all exercises well today with pain of 2/10 in Rt knee. Pt with difficulty and increased muscle fatigue with stair navigation attempting step over step. Continue skilled PT interventions to progress functional mobility for pt to return to work.   OBJECTIVE IMPAIRMENTS: decreased balance, decreased mobility, difficulty walking, decreased ROM, decreased strength, and pain.   ACTIVITY LIMITATIONS: lifting, standing, squatting, and stairs  PARTICIPATION LIMITATIONS: community activity, occupation, and yard work  PERSONAL FACTORS: 1 comorbidity: HTN are also affecting patient's functional outcome.   REHAB POTENTIAL: Good  CLINICAL DECISION MAKING: Stable/uncomplicated  EVALUATION COMPLEXITY: Low   GOALS: Goals reviewed with patient? Yes  SHORT TERM GOALS: (target date for Short term goals are 3 weeks 08/21/22)   1.   Patient will demonstrate independent use of home exercise program to maintain progress from in clinic treatments.  Goal status: Met 08/21/2022  LONG TERM GOALS: (target dates for all long term goals are 8 weeks  09/25/22 )   1. Patient will demonstrate/report pain at worst less than or equal to 2/10 to facilitate minimal limitation in daily activity secondary to pain symptoms.  Goal status: On Going 09/07/2022   2. Patient will demonstrate independent use of home exercise program to facilitate ability to maintain/progress functional gains from skilled physical therapy services.  Goal status: On Going 09/07/2022      3.  Patient will demonstrate Rt LE MMT 5/5 throughout to faciltiate usual transfers, stairs, squatting at [facility] for daily life.   Goal status: On Going 09/07/2022   4.  Patient will be able to navigate 1 flight of stairs with no railing with step over step gait pattern.  Goal status: On Going 09/07/2022   5.  Pt will be able to demonstrate full squat with no pain in his Rt knee.  Goal status: On Going 09/07/2022    PLAN:  PT FREQUENCY: 1-2x/week  PT DURATION: 10 weeks  PLANNED INTERVENTIONS: Therapeutic exercises, Therapeutic activity, Neuro Muscular re-education, Balance training, Gait training, Patient/Family education, Joint mobilization, Stair training, DME instructions, Dry Needling, Electrical stimulation, Traction, Cryotherapy, vasopneumatic deviceMoist heat, Taping, Ultrasound, Ionotophoresis 4mg /ml Dexamethasone, and Manual therapy.  All included unless contraindicated  PLAN FOR NEXT SESSION:   Step up progression,  LE (quadriceps emphasis) strengthening, bike, hamstrings stretching.  Next MD visit: 10/05/22   Kearney Hard, PT, MPT 09/07/22 12:29 PM

## 2022-09-09 ENCOUNTER — Encounter: Payer: Self-pay | Admitting: Physical Therapy

## 2022-09-09 ENCOUNTER — Ambulatory Visit (INDEPENDENT_AMBULATORY_CARE_PROVIDER_SITE_OTHER): Payer: Worker's Compensation | Admitting: Physical Therapy

## 2022-09-09 DIAGNOSIS — R600 Localized edema: Secondary | ICD-10-CM | POA: Diagnosis not present

## 2022-09-09 DIAGNOSIS — M6281 Muscle weakness (generalized): Secondary | ICD-10-CM

## 2022-09-09 DIAGNOSIS — M25561 Pain in right knee: Secondary | ICD-10-CM | POA: Diagnosis not present

## 2022-09-09 DIAGNOSIS — R262 Difficulty in walking, not elsewhere classified: Secondary | ICD-10-CM | POA: Diagnosis not present

## 2022-09-09 NOTE — Therapy (Signed)
OUTPATIENT PHYSICAL THERAPY TREATMENT   Patient Name: Jeffrey Walters MRN: [id number] DOB:[birth year], 57 y.o., male Today's Date: 09/09/2022  END OF SESSION:   PT End of Session - 09/09/22 1130     Visit Number 10    Number of Visits 16    Date for PT Re-Evaluation 09/25/22    Authorization - Visit Number 10    Authorization - Number of Visits 12    Progress Note Due on Visit 12    PT Start Time 1104    PT Stop Time 1145    PT Time Calculation (min) 41 min    Activity Tolerance Patient tolerated treatment well;No increased pain;Patient limited by pain    Behavior During Therapy [facility] for tasks assessed/performed             Past Medical History:  Diagnosis Date   Hypertension    Past Surgical History:  Procedure Laterality Date   KNEE SURGERY     Patient Active Problem List   Diagnosis Date Noted   Traumatic arthritis of right knee 06/26/2022   Primary hypertension 04/22/2022   Obesity (BMI 30-39.9) 04/22/2022     THERAPY DIAG:  Acute pain of right knee  Muscle weakness (generalized)  Difficulty in walking, not elsewhere classified  Localized edema  PCP: Ngetich, Nelda Bucks, NP    REFERRING PROVIDER:  Meredith Pel, MD  REFERRING DIAG: [phone] (ICD-10-CM) - Acute pain of right knee M12.561 (ICD-10-CM) - Traumatic arthritis of right knee  THERAPY DIAG:  Acute pain of right knee  Muscle weakness (generalized)  Difficulty in walking, not elsewhere classified  Localized edema  Rationale for Evaluation and Treatment: Rehabilitation  ONSET DATE: 05/27/22 at work   SUBJECTIVE STATEMENT: Pt stating 1/10 pain upon arrival. Pt reporting 4/10 pain with movements. Pt stating the stairs at our last visit were brutal on his Rt knee. Pt stating he had to ice down for hours later that evening.   PERTINENT HISTORY: HTN  PAIN:  NPRS scale:1/10 at present, 4/10 with exercises and movements Pain location: Rt medial knee, behind knee cap Pain  description: achy, throbbing Aggravating factors: sleeping, taking dog out, quick movements Relieving factors: ice, heat  PRECAUTIONS: None  WEIGHT BEARING RESTRICTIONS: No  FALLS:  Has patient fallen in last 6 months? Yes. Number of falls 1 tripped over hose at work  LIVING ENVIRONMENT: Lives with: lives with their family and lives with their spouse Lives in: House/apartment Stairs: Yes: External: 2 steps; on left going up Has following equipment at home: None  OCCUPATION: industrial saw supervisor  PLOF: Independent  PATIENT GOALS: Be able to walk dog without pain, return to work with out pain  Next MD visit: 10/05/22   OBJECTIVE:   DIAGNOSTIC FINDINGS:  07/11/22: Compared to 08/27/2009:   1. Interval ACL reconstruction. The graft fibers appear intact. 2. New high-grade radial tear within the mid transverse portion of the posterior horn of the medial meniscus with additional superior and inferior articular surface tears of the root of the posterior horn of the medial meniscus. 3. Chronic avulsion of the biceps femoris tendon insertion on the proximal fibula. 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 4. Possible interval repair of the prior full-thickness tear of the iliotibial band insertion. There is attenuation of the distal iliotibial band. 5. Chronic partial-thickness tearing of the popliteus tendon insertion on the lateral femoral condyle. 6. 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 7. Mild joint effusion. Small  Baker's cyst.  MRI scan which shows possibly new degenerative meniscal tear on the medial side in the setting of significant medial compartment arthritis along with visible ACL but torn chronically lateral collateral ligament.    PATIENT SURVEYS:  08/21/2022 FOTO 42  EVAL: FOTO intake:  40 (59 Goal in 15 visits)   EDEMA:  EVAL: Circumferential: Rt: 47.5 centimeters     Left 43.5 centimeters  PALPATION: 07/29/22: Medial joint line tenderness  LOWER EXTREMITY ROM:   ROM Right 07/29/22 Left 07/29/22 Rt 08/11/22 Right 08/21/2022 Rt 09/09/22  Knee flexion 110 130 118 115 118  Knee extension 0 0 0 0 0   (Blank rows = not tested)  LOWER EXTREMITY MMT:  MMT Right eval Left eval Left/Right in pounds  Knee flexion  5/5 118.6/25.1  Knee extension  5/5    (Blank rows = not tested)  LOWER EXTREMITY SPECIAL TESTS:  07/29/22: Knee special tests: Lachman Test: negative  FUNCTIONAL TESTS:  07/29/22:  5 time sit to stand: 28 seconds with UE support  GAIT: Distance walked: 40 feet level surface Assistive device utilized: None Level of assistance: Complete Independence Comments: antalgic gait, limp on the Rt LE with decreased weight bearing, pt wearing a donjoy brace on his Rt knee   TODAY'S TREATMENT                                                                          DATE: 09/09/22:  TherEx Recumbent bike: Level 8 seat 10 x 8 minutes Calf stretch on slant board: x 3 holding 30 seconds Leg Press: bil 150# 2 x 15, Rt LE: 75# x 15 TKE: green TB x 20 Supine SLR: 2 x 10 c ER Neuromuscular Re-edu Step down on 6 inch x 5 c Rt foot stationary on step (too painful for pt to continue) Step down on 4 inch x 15 c Rt foot stationary on step Tandem walking on foam beam x 4 c intermittent UE support, Tandum walking level surface x 3 with intermittent finger tap support   09/07/22:  TherEx Recumbent bike: Level 7 seat 10 x 8 minutes Calf stretch on slant board: x 3 holding 30 seconds Leg Press bil 137# 2x10; RLE only 68# 2x10 slow eccentrics RLE on 4 inch step while performing heel taps with Left x 10 Seated SLR c ER 2 x 10 Rt LE  Side stepping with Level 3 band 20 feet x 2 each direction Small lunge Level 3 band x 15 feet each direction Up and down 5 steps with rail with step over step (pt with muscle fatigue noted)      09/03/2022: Seated straight leg  raises 4 sets of 5 for 3 seconds Knee extension machine Isometrics at Position 4 and 5 10X 5 seconds each Upper heel cords box 1 minute NuStep Level 7 for 5 minutes  Functional Activities: Double Leg Press 2 sets of 10 with 125# slow eccentrics Single Leg Press 2 sets of 10 with 50# slow eccentrics  Neuromuscular re-education: Tandem balance 5X 20 seconds bilateral   08/26/22:  TherEx NuStep L6 x 8 min Calf stretch on slant board: x 3 holding 30 seconds Leg Press bil 125# 2x10; RLE only 50# 2x10 slow eccentrics  RLE on 4 inch step while performing heel taps with Left x 10 Supine SAQ: x 20 (yellow ball under Rt knee) holding 3 sec Supine Rt SLR c ER 2 x 10 Modalities:  Vasopneumatic device: 34 deg, medium compression, x 10 minutes     PATIENT EDUCATION:  Education details: HEP, POC Person educated: Patient Education method: Consulting civil engineer, Demonstration, Verbal cues, and Handouts Education comprehension: verbalized understanding, returned demonstration, and verbal cues required  HOME EXERCISE PROGRAM: Access Code: [email or web address] URL: [email or web address] Date: 07/29/2022 Prepared by: Kearney Hard  Exercises - Supine Quad Set on Towel Roll  - 2-3 x daily - 7 x weekly - 2 sets - 10 reps - 5 seconds hold - Supine Active Straight Leg Raise  - 2-3 x daily - 7 x weekly - 2 sets - 10 reps - Seated Long Arc Quad  - 2-3 x daily - 7 x weekly - 2 sets - 10 reps - 3 seconds hold - Sit to Stand  - 2-3 x daily - 7 x weekly - 10 reps  ASSESSMENT:  CLINICAL IMPRESSION: Pt reporting fatigue and soreness following our last visit. Pt reporting 4/10 pain today during activities. Pt still presenting with pain and difficulty with descending stairs >/= 4 inches. Continue skilled PT interventions to progress functional mobility for pt to return to work.   OBJECTIVE IMPAIRMENTS: decreased balance, decreased mobility, difficulty walking, decreased ROM, decreased strength, and pain.    ACTIVITY LIMITATIONS: lifting, standing, squatting, and stairs  PARTICIPATION LIMITATIONS: community activity, occupation, and yard work  PERSONAL FACTORS: 1 comorbidity: HTN are also affecting patient's functional outcome.   REHAB POTENTIAL: Good  CLINICAL DECISION MAKING: Stable/uncomplicated  EVALUATION COMPLEXITY: Low   GOALS: Goals reviewed with patient? Yes  SHORT TERM GOALS: (target date for Short term goals are 3 weeks 08/21/22)   1.  Patient will demonstrate independent use of home exercise program to maintain progress from in clinic treatments.  Goal status: Met 08/21/2022  LONG TERM GOALS: (target dates for all long term goals are 8 weeks  09/25/22 )   1. Patient will demonstrate/report pain at worst less than or equal to 2/10 to facilitate minimal limitation in daily activity secondary to pain symptoms.  Goal status: On Going 09/07/2022   2. Patient will demonstrate independent use of home exercise program to facilitate ability to maintain/progress functional gains from skilled physical therapy services.  Goal status: On Going 09/07/2022      3.  Patient will demonstrate Rt LE MMT 5/5 throughout to faciltiate usual transfers, stairs, squatting at [facility] for daily life.   Goal status: On Going 09/07/2022   4.  Patient will be able to navigate 1 flight of stairs with no railing with step over step gait pattern.  Goal status: On Going 09/07/2022   5.  Pt will be able to demonstrate full squat with no pain in his Rt knee.  Goal status: On Going 09/07/2022    PLAN:  PT FREQUENCY: 1-2x/week  PT DURATION: 10 weeks  PLANNED INTERVENTIONS: Therapeutic exercises, Therapeutic activity, Neuro Muscular re-education, Balance training, Gait training, Patient/Family education, Joint mobilization, Stair training, DME instructions, Dry Needling, Electrical stimulation, Traction, Cryotherapy, vasopneumatic deviceMoist heat, Taping, Ultrasound, Ionotophoresis 4mg /ml Dexamethasone,  and Manual therapy.  All included unless contraindicated  PLAN FOR NEXT SESSION:   Step up progression,  LE (quadriceps emphasis) strengthening, bike, hamstrings stretching.  Next MD visit: 10/05/22   Kearney Hard, PT, MPT 09/09/22 11:37 AM

## 2022-09-15 ENCOUNTER — Ambulatory Visit (INDEPENDENT_AMBULATORY_CARE_PROVIDER_SITE_OTHER): Payer: BC Managed Care – PPO | Admitting: Physical Therapy

## 2022-09-15 ENCOUNTER — Encounter: Payer: Self-pay | Admitting: Physical Therapy

## 2022-09-15 DIAGNOSIS — R600 Localized edema: Secondary | ICD-10-CM

## 2022-09-15 DIAGNOSIS — M6281 Muscle weakness (generalized): Secondary | ICD-10-CM

## 2022-09-15 DIAGNOSIS — R262 Difficulty in walking, not elsewhere classified: Secondary | ICD-10-CM

## 2022-09-15 DIAGNOSIS — M25561 Pain in right knee: Secondary | ICD-10-CM

## 2022-09-15 NOTE — Therapy (Signed)
OUTPATIENT PHYSICAL THERAPY TREATMENT   Patient Name: Jeffrey Walters MRN: [id number] DOB:May 12, 1966, 57 y.o., male Today's Date: 09/15/2022  END OF SESSION:   PT End of Session - 09/15/22 1115     Visit Number 11    Number of Visits 16    Date for PT Re-Evaluation 09/25/22    Authorization Type WC - 12 visits approved    Authorization - Visit Number 11    Authorization - Number of Visits 12    Progress Note Due on Visit 12    PT Start Time 1107    PT Stop Time 1145    PT Time Calculation (min) 38 min    Activity Tolerance Patient tolerated treatment well;No increased pain;Patient limited by pain    Behavior During Therapy [facility] for tasks assessed/performed             Past Medical History:  Diagnosis Date   Hypertension    Past Surgical History:  Procedure Laterality Date   KNEE SURGERY     Patient Active Problem List   Diagnosis Date Noted   Traumatic arthritis of right knee 06/26/2022   Primary hypertension 04/22/2022   Obesity (BMI 30-39.9) 04/22/2022     THERAPY DIAG:  Acute pain of right knee  Muscle weakness (generalized)  Difficulty in walking, not elsewhere classified  Localized edema  PCP: Ngetich, Nelda Bucks, NP    REFERRING PROVIDER:  Meredith Pel, MD  REFERRING DIAG: [phone] (ICD-10-CM) - Acute pain of right knee M12.561 (ICD-10-CM) - Traumatic arthritis of right knee  THERAPY DIAG:  Acute pain of right knee  Muscle weakness (generalized)  Difficulty in walking, not elsewhere classified  Localized edema  Rationale for Evaluation and Treatment: Rehabilitation  ONSET DATE: 05/27/22 at work   SUBJECTIVE STATEMENT: Pt reporting pain with movement of 4/10. Pt stating he still feels like his Rt LE is extremely weak compared to his left.   PERTINENT HISTORY: HTN  PAIN:  NPRS scale:4/10 with exercises and movements Pain location: Rt medial knee, behind knee cap Pain description: achy, throbbing Aggravating factors:  sleeping, taking dog out, quick movements Relieving factors: ice, heat  PRECAUTIONS: None  WEIGHT BEARING RESTRICTIONS: No  FALLS:  Has patient fallen in last 6 months? Yes. Number of falls 1 tripped over hose at work  LIVING ENVIRONMENT: Lives with: lives with their family and lives with their spouse Lives in: House/apartment Stairs: Yes: External: 2 steps; on left going up Has following equipment at home: None  OCCUPATION: industrial saw supervisor  PLOF: Independent  PATIENT GOALS: Be able to walk dog without pain, return to work with out pain  Next MD visit: 10/05/22   OBJECTIVE:   DIAGNOSTIC FINDINGS:  07/11/22: Compared to 08/27/2009:   1. Interval ACL reconstruction. The graft fibers appear intact. 2. New high-grade radial tear within the mid transverse portion of the posterior horn of the medial meniscus with additional superior and inferior articular surface tears of the root of the posterior horn of the medial meniscus. 3. Chronic avulsion of the biceps femoris tendon insertion on the proximal fibula. 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 4. Possible interval repair of the prior full-thickness tear of the iliotibial band insertion. There is attenuation of the distal iliotibial band. 5. Chronic partial-thickness tearing of the popliteus tendon insertion on the lateral femoral condyle. 6. 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 7. Mild joint effusion. Small Baker's cyst.  MRI scan which shows possibly  new degenerative meniscal tear on the medial side in the setting of significant medial compartment arthritis along with visible ACL but torn chronically lateral collateral ligament.    PATIENT SURVEYS:  08/21/2022 FOTO 42  EVAL: FOTO intake:  40 (59 Goal in 15 visits)   EDEMA:  EVAL: Circumferential: Rt: 47.5 centimeters    Left 43.5 centimeters  PALPATION: 07/29/22:  Medial joint line tenderness  LOWER EXTREMITY ROM:   ROM Right 07/29/22 Left 07/29/22 Rt 08/11/22 Right 08/21/2022 Rt 09/09/22 Rt 09/15/22  Knee flexion 110 130 118 115 118 118  Knee extension 0 0 0 0 0 0   (Blank rows = not tested)  LOWER EXTREMITY MMT:  MMT Right eval Left eval Left/Right in pounds  Knee flexion  5/5 118.6/25.1  Knee extension  5/5    (Blank rows = not tested)  LOWER EXTREMITY SPECIAL TESTS:  07/29/22: Knee special tests: Lachman Test: negative  FUNCTIONAL TESTS:  07/29/22:  5 time sit to stand: 28 seconds with UE support  GAIT: Distance walked: 40 feet level surface Assistive device utilized: None Level of assistance: Complete Independence Comments: antalgic gait, limp on the Rt LE with decreased weight bearing, pt wearing a donjoy brace on his Rt knee   TODAY'S TREATMENT                                                                          DATE: 09/15/22:  TherEx Recumbent bike: Level 8 seat 10 x 8 minutes Leg Press: bil 150# 2 x 15, Rt LE: 87# 2 x 10 Wall squats 2 x 10 c red physio ball against back Lunging on 8 inch step with stretch Rt LE Neuromuscular Re-edu Standing on BOSU ball dome up x 2 minutes c finger tap support from bilateral UE's Single leg dead lift 10# x3 (difficulty with balance and pt reporting increased pain Walking lunges 30 feet x 2    09/09/22:  TherEx Recumbent bike: Level 8 seat 10 x 8 minutes Calf stretch on slant board: x 3 holding 30 seconds Leg Press: bil 150# 2 x 15, Rt LE: 75# x 15 TKE: green TB x 20 Supine SLR: 2 x 10 c ER Neuromuscular Re-edu Step down on 6 inch x 5 c Rt foot stationary on step (too painful for pt to continue) Step down on 4 inch x 15 c Rt foot stationary on step Tandem walking on foam beam x 4 c intermittent UE support, Tandum walking level surface x 3 with intermittent finger tap support   09/07/22:  TherEx Recumbent bike: Level 7 seat 10 x 8 minutes Calf stretch on slant board: x 3 holding  30 seconds Leg Press bil 137# 2x10; RLE only 68# 2x10 slow eccentrics RLE on 4 inch step while performing heel taps with Left x 10 Seated SLR c ER 2 x 10 Rt LE  Side stepping with Level 3 band 20 feet x 2 each direction Small lunge Level 3 band x 15 feet each direction Up and down 5 steps with rail with step over step (pt with muscle fatigue noted)   09/03/2022: Seated straight leg raises 4 sets of 5 for 3 seconds Knee extension machine Isometrics at Position 4 and 5 10X 5  seconds each Upper heel cords box 1 minute NuStep Level 7 for 5 minutes  Functional Activities: Double Leg Press 2 sets of 10 with 125# slow eccentrics Single Leg Press 2 sets of 10 with 50# slow eccentrics  Neuromuscular re-education: Tandem balance 5X 20 seconds bilateral   PATIENT EDUCATION:  Education details: HEP, POC Person educated: Patient Education method: Consulting civil engineer, Demonstration, Verbal cues, and Handouts Education comprehension: verbalized understanding, returned demonstration, and verbal cues required  HOME EXERCISE PROGRAM: Access Code: [email or web address] URL: [email or web address] Date: 07/29/2022 Prepared by: Kearney Hard  Exercises - Supine Quad Set on Towel Roll  - 2-3 x daily - 7 x weekly - 2 sets - 10 reps - 5 seconds hold - Supine Active Straight Leg Raise  - 2-3 x daily - 7 x weekly - 2 sets - 10 reps - Seated Long Arc Quad  - 2-3 x daily - 7 x weekly - 2 sets - 10 reps - 3 seconds hold - Sit to Stand  - 2-3 x daily - 7 x weekly - 10 reps  ASSESSMENT:  CLINICAL IMPRESSION: Pt reporting 4/10 pain with exercise and movements. Pt still with weakness noted in his Rt quads, hamstrings and glutes. Pt with increased difficulty with dead lift and SLS activities. Pt required constant verbal cues for technique correction duing dead lift attempts and walking lunges. I am recommending continuing skilled PT interventions to maximize pt's function for pt to return to work related  activities.   OBJECTIVE IMPAIRMENTS: decreased balance, decreased mobility, difficulty walking, decreased ROM, decreased strength, and pain.   ACTIVITY LIMITATIONS: lifting, standing, squatting, and stairs  PARTICIPATION LIMITATIONS: community activity, occupation, and yard work  PERSONAL FACTORS: 1 comorbidity: HTN are also affecting patient's functional outcome.   REHAB POTENTIAL: Good  CLINICAL DECISION MAKING: Stable/uncomplicated  EVALUATION COMPLEXITY: Low   GOALS: Goals reviewed with patient? Yes  SHORT TERM GOALS: (target date for Short term goals are 3 weeks 08/21/22)   1.  Patient will demonstrate independent use of home exercise program to maintain progress from in clinic treatments.  Goal status: Met 08/21/2022  LONG TERM GOALS: (target dates for all long term goals are 8 weeks  09/25/22 )   1. Patient will demonstrate/report pain at worst less than or equal to 2/10 to facilitate minimal limitation in daily activity secondary to pain symptoms.  Goal status: On Going 09/07/2022   2. Patient will demonstrate independent use of home exercise program to facilitate ability to maintain/progress functional gains from skilled physical therapy services.  Goal status: On Going 09/07/2022      3.  Patient will demonstrate Rt LE MMT 5/5 throughout to faciltiate usual transfers, stairs, squatting at [facility] for daily life.   Goal status: On Going 09/07/2022   4.  Patient will be able to navigate 1 flight of stairs with no railing with step over step gait pattern.  Goal status: On Going 09/07/2022   5.  Pt will be able to demonstrate full squat with no pain in his Rt knee.  Goal status: On Going 09/15/22    PLAN:  PT FREQUENCY: 1-2x/week  PT DURATION: 10 weeks  PLANNED INTERVENTIONS: Therapeutic exercises, Therapeutic activity, Neuro Muscular re-education, Balance training, Gait training, Patient/Family education, Joint mobilization, Stair training, DME instructions, Dry  Needling, Electrical stimulation, Traction, Cryotherapy, vasopneumatic deviceMoist heat, Taping, Ultrasound, Ionotophoresis 4mg /ml Dexamethasone, and Manual therapy.  All included unless contraindicated  PLAN FOR NEXT SESSION:   Will need to reapply for more WC visits at next  visit for further LE strengthening  Next MD visit: 10/05/22   Kearney Hard, PT, MPT 09/15/22 11:27 AM

## 2022-09-17 ENCOUNTER — Ambulatory Visit: Payer: Worker's Compensation | Admitting: Rehabilitative and Restorative Service Providers"

## 2022-09-17 ENCOUNTER — Encounter: Payer: Self-pay | Admitting: Rehabilitative and Restorative Service Providers"

## 2022-09-17 DIAGNOSIS — M25561 Pain in right knee: Secondary | ICD-10-CM

## 2022-09-17 DIAGNOSIS — R262 Difficulty in walking, not elsewhere classified: Secondary | ICD-10-CM | POA: Diagnosis not present

## 2022-09-17 DIAGNOSIS — R600 Localized edema: Secondary | ICD-10-CM

## 2022-09-17 DIAGNOSIS — M6281 Muscle weakness (generalized): Secondary | ICD-10-CM | POA: Diagnosis not present

## 2022-09-17 NOTE — Therapy (Signed)
OUTPATIENT PHYSICAL THERAPY TREATMENT/PROGRESS NOTE/RE-CERTIFICATION   Patient Name: Jeffrey Walters MRN: [id number] DOB:30-Jul-1965, 57 y.o., male Today's Date: 09/17/2022  END OF SESSION:   PT End of Session - 09/17/22 1229     Visit Number 12    Number of Visits 16    Date for PT Re-Evaluation 09/25/22    Authorization Type WC - 12 visits approved    Authorization - Visit Number 12    Authorization - Number of Visits 12    Progress Note Due on Visit 12    PT Start Time [id number]    PT Stop Time 1146    PT Time Calculation (min) 41 min    Activity Tolerance Patient tolerated treatment well;No increased pain;Patient limited by pain    Behavior During Therapy [facility] for tasks assessed/performed            Progress Note Reporting Period 07/29/2022 to 09/17/2022  See note below for Objective Data and Assessment of Progress/Goals.   Past Medical History:  Diagnosis Date   Hypertension    Past Surgical History:  Procedure Laterality Date   KNEE SURGERY     Patient Active Problem List   Diagnosis Date Noted   Traumatic arthritis of right knee 06/26/2022   Primary hypertension 04/22/2022   Obesity (BMI 30-39.9) 04/22/2022     THERAPY DIAG:  Difficulty in walking, not elsewhere classified - Plan: PT plan of care cert/re-cert  Localized edema - Plan: PT plan of care cert/re-cert  Muscle weakness (generalized) - Plan: PT plan of care cert/re-cert  Acute pain of right knee - Plan: PT plan of care cert/re-cert  PCP: Sandrea Hughs, NP    REFERRING PROVIDER:  Meredith Pel, MD  REFERRING DIAG: [phone] (ICD-10-CM) - Acute pain of right knee M12.561 (ICD-10-CM) - Traumatic arthritis of right knee  THERAPY DIAG:  Acute pain of right knee  Muscle weakness (generalized)  Difficulty in walking, not elsewhere classified  Localized edema  Rationale for Evaluation and Treatment: Rehabilitation  ONSET DATE: 05/27/22 at work   SUBJECTIVE STATEMENT: [name] has  been very good overall with his HEP compliance.  He notes progress with his right lower extremity function, although right leg weakness and instability still limit some ADLs.  PERTINENT HISTORY: HTN  PAIN:  NPRS scale: 2-7/10 this week (7/10 when dog causes jerking motion) Pain location: Rt medial knee, behind knee cap Pain description: achy, throbbing Aggravating factors: sleeping, taking dog out, quick movements Relieving factors: ice, heat  PRECAUTIONS: None  WEIGHT BEARING RESTRICTIONS: No  FALLS:  Has patient fallen in last 6 months? Yes. Number of falls 1 tripped over hose at work  LIVING ENVIRONMENT: Lives with: lives with their family and lives with their spouse Lives in: House/apartment Stairs: Yes: External: 2 steps; on left going up Has following equipment at home: None  OCCUPATION: industrial saw supervisor  PLOF: Independent  PATIENT GOALS: Be able to walk dog without pain, return to work with out pain  Next MD visit: 10/05/22   OBJECTIVE:   DIAGNOSTIC FINDINGS:  07/11/22: Compared to 08/27/2009:   1. Interval ACL reconstruction. The graft fibers appear intact. 2. New high-grade radial tear within the mid transverse portion of the posterior horn of the medial meniscus with additional superior and inferior articular surface tears of the root of the posterior horn of the medial meniscus. 3. Chronic avulsion of the biceps femoris tendon insertion on the proximal fibula. 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 4. Possible interval  repair of the prior full-thickness tear of the iliotibial band insertion. There is attenuation of the distal iliotibial band. 5. Chronic partial-thickness tearing of the popliteus tendon insertion on the lateral femoral condyle. 6. 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 7. Mild joint effusion. Small Baker's cyst.  MRI scan which shows possibly  new degenerative meniscal tear on the medial side in the setting of significant medial compartment arthritis along with visible ACL but torn chronically lateral collateral ligament.    PATIENT SURVEYS:  09/17/2022 FOTO 59 (Goal met)  08/21/2022 FOTO 42  EVAL: FOTO intake:  40 (59 Goal in 15 visits)   EDEMA:  EVAL: Circumferential: Rt: 47.5 centimeters    Left 43.5 centimeters  PALPATION: 07/29/22: Medial joint line tenderness  LOWER EXTREMITY ROM:   ROM Right 07/29/22 Left 07/29/22 Rt 08/11/22 Right 08/21/2022 Rt 09/09/22 Rt 09/15/22 Right 09/17/2022  Knee flexion 110 130 118 115 118 118 133  Knee extension 0 0 0 0 0 0 0   (Blank rows = not tested)  LOWER EXTREMITY MMT:  MMT Right eval Left eval Left/Right in pounds 08/21/2022 Right in pounds 09/17/2022  Knee flexion  5/5 118.6/25.1 80.8 pounds * with 4/10 pain  Knee extension  5/5     (Blank rows = not tested)  LOWER EXTREMITY SPECIAL TESTS:  07/29/22: Knee special tests: Lachman Test: negative  FUNCTIONAL TESTS:  07/29/22:  5 time sit to stand: 28 seconds with UE support  GAIT: Distance walked: 40 feet level surface Assistive device utilized: None Level of assistance: Complete Independence Comments: antalgic gait, limp on the Rt LE with decreased weight bearing, pt wearing a donjoy brace on his Rt knee   TODAY'S TREATMENT                                                                          DATE: 09/17/2022 Seated straight leg raises 4 sets of 5 for 3 seconds 1# [employer] with [employer], limited range 2 sets of 10 with 5 mini-bounces  Functional Activities: Double Leg Press 15 with 5 mini-bounces with 150# slow eccentrics Single Leg Press 2 sets of 10 with 5 mini-bounces with 62# slow eccentrics 8 inch step lunge/stretch 10X Bilateral  Reassessment and progress note   09/15/22:  TherEx Recumbent bike: Level 8 seat 10 x 8 minutes Leg Press: bil 150# 2 x 15, Rt LE: 87# 2 x 10 Wall squats 2 x 10 c red physio ball  against back Lunging on 8 inch step with stretch Rt LE Neuromuscular Re-edu Standing on BOSU ball dome up x 2 minutes c finger tap support from bilateral UE's Single leg dead lift 10# x3 (difficulty with balance and pt reporting increased pain Walking lunges 30 feet x 2   09/09/22:  TherEx Recumbent bike: Level 8 seat 10 x 8 minutes Calf stretch on slant board: x 3 holding 30 seconds Leg Press: bil 150# 2 x 15, Rt LE: 75# x 15 TKE: green TB x 20 Supine SLR: 2 x 10 c ER Neuromuscular Re-edu Step down on 6 inch x 5 c Rt foot stationary on step (too painful for pt to continue) Step down on 4 inch x 15  c Rt foot stationary on step Tandem walking on foam beam x 4 c intermittent UE support, Tandum walking level surface x 3 with intermittent finger tap support   PATIENT EDUCATION:  Education details: HEP, POC Person educated: Patient Education method: Consulting civil engineer, Demonstration, Verbal cues, and Handouts Education comprehension: verbalized understanding, returned demonstration, and verbal cues required  HOME EXERCISE PROGRAM: Access Code: [email or web address] URL: [email or web address] Date: 07/29/2022 Prepared by: Kearney Hard  Exercises - Supine Quad Set on Towel Roll  - 2-3 x daily - 7 x weekly - 2 sets - 10 reps - 5 seconds hold - Supine Active Straight Leg Raise  - 2-3 x daily - 7 x weekly - 2 sets - 10 reps - Seated Long Arc Quad  - 2-3 x daily - 7 x weekly - 2 sets - 10 reps - 3 seconds hold - Sit to Stand  - 2-3 x daily - 7 x weekly - 10 reps  ASSESSMENT:  CLINICAL IMPRESSION: Pheonix is doing much better functionally compared to when he started PT ~ 7 weeks ago.  He still gets sharp pain with certain quick movements and is ready to get off his feet after a full day working.  Quadriceps strength is now ~67% (was ~21%) of the uninvolved left.  Better, but still significantly impaired and consistent with Jerian's comments about being limited with his weight-bearing  function later in the day.  He will benefit from an additional 4 weeks of quadriceps strengthening focused supervised PT to address his roughly 1/3 strength deficit before transfer into independent rehabilitation.  OBJECTIVE IMPAIRMENTS: decreased balance, decreased mobility, difficulty walking, decreased ROM, decreased strength, and pain.   ACTIVITY LIMITATIONS: lifting, standing, squatting, and stairs  PARTICIPATION LIMITATIONS: community activity, occupation, and yard work  PERSONAL FACTORS: 1 comorbidity: HTN are also affecting patient's functional outcome.   REHAB POTENTIAL: Good  CLINICAL DECISION MAKING: Stable/uncomplicated  EVALUATION COMPLEXITY: Low   GOALS: Goals reviewed with patient? Yes  SHORT TERM GOALS: (target date for Short term goals are 3 weeks 08/21/22)   1.  Patient will demonstrate independent use of home exercise program to maintain progress from in clinic treatments.  Goal status: Met 08/21/2022  LONG TERM GOALS: (target dates for all long term goals are 12 total  weeks  10/23/22 )   1. Patient will demonstrate/report pain at worst less than or equal to 2/10 to facilitate minimal limitation in daily activity secondary to pain symptoms.  Goal status: On Going 09/17/2022   2. Patient will demonstrate independent use of home exercise program to facilitate ability to maintain/progress functional gains from skilled physical therapy services.  Goal status: On Going 09/17/2022      3.  Patient will demonstrate Rt LE MMT 5/5 throughout to faciltiate usual transfers, stairs, squatting at [facility] for daily life.   Goal status: On Going 09/17/2022   4.  Patient will be able to navigate 1 flight of stairs with no railing with step over step gait pattern.  Goal status: On Going 09/17/2022   5.  Pt will be able to demonstrate full squat with no pain in his Rt knee.  Goal status: On Going 09/17/22    PLAN:  PT FREQUENCY: 2x/week  PT DURATION: 4 additional weeks (to Oct 23, 2022)  PLANNED INTERVENTIONS: Therapeutic exercises, Therapeutic activity, Neuro Muscular re-education, Balance training, Gait training, Patient/Family education, Joint mobilization, Stair training, DME instructions, Dry Needling, Electrical stimulation, Traction, Cryotherapy, vasopneumatic deviceMoist heat, Taping, Ultrasound, Ionotophoresis 4mg /ml Dexamethasone, and Manual therapy.  All included unless contraindicated  PLAN FOR NEXT SESSION:   Continue quadriceps strength progressions.  Next MD visit: 10/05/22   Farley Ly, PT, MPT 09/17/22 1:13 PM

## 2022-09-25 ENCOUNTER — Ambulatory Visit: Payer: BC Managed Care – PPO | Admitting: Orthopedic Surgery

## 2022-10-05 ENCOUNTER — Ambulatory Visit (INDEPENDENT_AMBULATORY_CARE_PROVIDER_SITE_OTHER): Payer: Worker's Compensation | Admitting: Orthopedic Surgery

## 2022-10-05 DIAGNOSIS — M25561 Pain in right knee: Secondary | ICD-10-CM | POA: Diagnosis not present

## 2022-10-05 DIAGNOSIS — M12561 Traumatic arthropathy, right knee: Secondary | ICD-10-CM

## 2022-10-06 ENCOUNTER — Encounter: Payer: Self-pay | Admitting: Orthopedic Surgery

## 2022-10-06 ENCOUNTER — Telehealth: Payer: Self-pay

## 2022-10-06 NOTE — Progress Notes (Unsigned)
Office Visit Note   PatienNAJIB Walters           Date of Birth: 1965/10/30           MRN: [id number] Visit Date: 10/05/2022 Requested by: Caesar Bookman, NP [address] [place],  [place] [address] PCP: Caesar Bookman, NP  Subjective: Chief Complaint  Patient presents with   Right Knee - Follow-up    HPI: Jeffrey Walters is a 57 y.o. male who presents to the office reporting continued right knee pain and instability.  Has been doing physical therapy 2 times a week and also does home exercise program daily.  Overall he feels like his right knee pain is improving along with the instability as his leg is stronger.  Therapy also confirms that his leg is getting stronger.  Pain is decreasing from 7 down to 4.  He has been out of work since December 10.  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  He is able to walk 2-3 blocks.  His job requires about 8 hours a day of lifting.  He uses a brace..                ROS: All systems reviewed are negative as they relate to the chief complaint within the history of present illness.  Patient denies fevers or chills.  Assessment & Plan: Visit Diagnoses:  1. Acute pain of right knee   2. Traumatic arthritis of right knee     Plan: Impression is improvement in right knee with therapy suggesting that 1 more month of physical therapy could help him.  I really think that he is very very close to plateauing in terms of how much therapy is going to help.  Plan at this time is out of work 6 weeks.  Continue therapy for 4 weeks.  Home exercise program as well.  FCE in 4 weeks with return office visit in 5 weeks to determine suitability to return to his job.  Do not anticipate any further physical therapy or work hardening intervention to be helpful for this complex knee after that.  Follow-Up Instructions: Return in about 5 weeks (around 11/09/2022).   Orders:  Orders Placed This Encounter  Procedures   Ambulatory  referral to Physical Therapy   No orders of the defined types were placed in this encounter.     Procedures: No procedures performed   Clinical Data: No additional findings.  Objective: Vital Signs: There were no vitals taken for this visit.  Physical Exam:  Constitutional: Patient appears well-developed HEENT:  Head: Normocephalic Eyes:EOM are normal Neck: Normal range of motion Cardiovascular: Normal rate Pulmonary/chest: Effort normal Neurologic: Patient is alert Skin: Skin is warm Psychiatric: Patient has normal mood and affect  Ortho Exam: Ortho exam demonstrates increased quad extension strength from objectively measured by the therapist of 25 pounds to 80 pounds.  No effusion present.  Range of motion unchanged.  Does have reasonable stability anteriorly.  Ankle dorsiflexion intact.  Rest of the Ortho exam is unchanged.  Specialty Comments:  No specialty comments available.  Imaging: No results found.   PMFS History: Patient Active Problem List   Diagnosis Date Noted   Traumatic arthritis of right knee 06/26/2022   Primary hypertension 04/22/2022   Obesity (BMI 30-39.9) 04/22/2022   Past Medical History:  Diagnosis Date   Hypertension     Family History  Problem Relation Age of Onset   Cancer  Mother        breast   Cancer Sister        breast    Past Surgical History:  Procedure Laterality Date   KNEE SURGERY     Social History   Occupational History   Not on file  Tobacco Use   Smoking status: Former    Packs/day: 1.5    Types: Cigarettes    Quit date: 2003    Years since quitting: 21.3   Smokeless tobacco: Never  Vaping Use   Vaping Use: Never used  Substance and Sexual Activity   Alcohol use: Yes    Comment: socially   Drug use: No   Sexual activity: Yes

## 2022-10-06 NOTE — Telephone Encounter (Signed)
Patient needs FCE to be done in 4 weeks. I have placed order for this.

## 2022-10-06 NOTE — Telephone Encounter (Signed)
Will have him dictate asap

## 2022-10-13 ENCOUNTER — Encounter: Payer: BC Managed Care – PPO | Admitting: Physical Therapy

## 2022-10-13 ENCOUNTER — Ambulatory Visit (INDEPENDENT_AMBULATORY_CARE_PROVIDER_SITE_OTHER): Payer: Worker's Compensation | Admitting: Physical Therapy

## 2022-10-13 ENCOUNTER — Encounter: Payer: Self-pay | Admitting: Physical Therapy

## 2022-10-13 DIAGNOSIS — M25561 Pain in right knee: Secondary | ICD-10-CM | POA: Diagnosis not present

## 2022-10-13 DIAGNOSIS — R600 Localized edema: Secondary | ICD-10-CM

## 2022-10-13 DIAGNOSIS — R262 Difficulty in walking, not elsewhere classified: Secondary | ICD-10-CM

## 2022-10-13 DIAGNOSIS — M6281 Muscle weakness (generalized): Secondary | ICD-10-CM | POA: Diagnosis not present

## 2022-10-13 NOTE — Therapy (Addendum)
OUTPATIENT PHYSICAL THERAPY TREATMENT   Patient Name: Jeffrey Walters MRN: [id number] DOB:01-01-1966, 57 y.o., male Today's Date: 10/13/2022  END OF SESSION:   PT End of Session - 10/13/22 1233     Visit Number 13    Number of Visits 20    Date for PT Re-Evaluation 10/23/22    Authorization Type WC - 12 visits approved from 12/22    Authorization - Visit Number 13    Authorization - Number of Visits 24    Progress Note Due on Visit 24    PT Start Time 1150    PT Stop Time 1228    PT Time Calculation (min) 38 min    Activity Tolerance Patient tolerated treatment well;Patient limited by pain    Behavior During Therapy [facility] for tasks assessed/performed              Past Medical History:  Diagnosis Date   Hypertension    Past Surgical History:  Procedure Laterality Date   KNEE SURGERY     Patient Active Problem List   Diagnosis Date Noted   Traumatic arthritis of right knee 06/26/2022   Primary hypertension 04/22/2022   Obesity (BMI 30-39.9) 04/22/2022     THERAPY DIAG:  Difficulty in walking, not elsewhere classified  Localized edema  Muscle weakness (generalized)  Acute pain of right knee  PCP: Ngetich, Donalee Citrin, NP    REFERRING PROVIDER:  Cammy Copa, MD  REFERRING DIAG: [phone] (ICD-10-CM) - Acute pain of right knee M12.561 (ICD-10-CM) - Traumatic arthritis of right knee  THERAPY DIAG:  Acute pain of right knee  Muscle weakness (generalized)  Difficulty in walking, not elsewhere classified  Localized edema  Rationale for Evaluation and Treatment: Rehabilitation  ONSET DATE: 05/27/22 at work   SUBJECTIVE STATEMENT: Pt arriving today reporting he has been walking 25-30 minutes 4 times each week. Pt stating he is wearing his knee brace less and his pain is down. Pt stating he had to wait for workers comp to approve more visits before he could schedule another appointment.   PERTINENT HISTORY: HTN  PAIN:  NPRS scale: no pain at  rest upon arrival Pain location: Rt medial knee, behind knee cap Pain description: achy, throbbing Aggravating factors: sleeping, taking dog out, quick movements Relieving factors: ice, heat  PRECAUTIONS: None  WEIGHT BEARING RESTRICTIONS: No  FALLS:  Has patient fallen in last 6 months? Yes. Number of falls 1 tripped over hose at work  LIVING ENVIRONMENT: Lives with: lives with their family and lives with their spouse Lives in: House/apartment Stairs: Yes: External: 2 steps; on left going up Has following equipment at home: None  OCCUPATION: industrial saw supervisor  PLOF: Independent  PATIENT GOALS: Be able to walk dog without pain, return to work with out pain  Next MD visit: 10/05/22   OBJECTIVE:   DIAGNOSTIC FINDINGS:  07/11/22: Compared to 08/27/2009:   1. Interval ACL reconstruction. The graft fibers appear intact. 2. New high-grade radial tear within the mid transverse portion of the posterior horn of the medial meniscus with additional superior and inferior articular surface tears of the root of the posterior horn of the medial meniscus. 3. Chronic avulsion of the biceps femoris tendon insertion on the proximal fibula. 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 4. Possible interval repair of the prior full-thickness tear of the iliotibial band insertion. There is attenuation of the distal iliotibial band. 5. Chronic partial-thickness tearing of the popliteus tendon insertion on the lateral femoral  condyle. 6. 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 7. Mild joint effusion. Small Baker's cyst.  MRI scan which shows possibly new degenerative meniscal tear on the medial side in the setting of significant medial compartment arthritis along with visible ACL but torn chronically lateral collateral ligament.    PATIENT SURVEYS:  09/17/2022 FOTO 59 (Goal met)  08/21/2022 FOTO 42  EVAL:  FOTO intake:  40 (59 Goal in 15 visits)   EDEMA:  EVAL: Circumferential: Rt: 47.5 centimeters    Left 43.5 centimeters  PALPATION: 07/29/22: Medial joint line tenderness  LOWER EXTREMITY ROM:   ROM Right 07/29/22 Left 07/29/22 Rt 08/11/22 Right 08/21/2022 Rt 09/09/22 Rt 09/15/22 Right 09/17/2022  Knee flexion 110 130 118 115 118 118 133  Knee extension 0 0 0 0 0 0 0   (Blank rows = not tested)  LOWER EXTREMITY MMT:  MMT Right eval Left eval Left/Right in pounds 08/21/2022 Right in pounds 09/17/2022  Knee flexion  5/5 118.6/25.1 80.8 pounds * with 4/10 pain  Knee extension  5/5     (Blank rows = not tested)  LOWER EXTREMITY SPECIAL TESTS:  07/29/22: Knee special tests: Lachman Test: negative  FUNCTIONAL TESTS:  07/29/22:  5 time sit to stand: 28 seconds with UE support 10/13/22:   GAIT: Distance walked: 40 feet level surface Assistive device utilized: None Level of assistance: Complete Independence Comments: antalgic gait, limp on the Rt LE with decreased weight bearing, pt wearing a donjoy brace on his Rt knee   TODAY'S TREATMENT                                                                          DATE: 10/13/22:  TherEx Recumbent bike: Level 8 seat 10 x 8 minutes Leg Press: bil 168# 2 x 15, Rt LE: 87# x 10, 93# x 10 Rt LE step downs on 6 inch step 3 x 10 c finger tap support TRX squats 3 x 10 Lunge hip flexor stretch x 2 holding 30 sec  Pt discussed purchasing a TRX similar device for home use.      09/17/2022 Seated straight leg raises 4 sets of 5 for 3 seconds 1# Ball Squats with [employer], limited range 2 sets of 10 with 5 mini-bounces  Functional Activities: Double Leg Press 15 with 5 mini-bounces with 150# slow eccentrics Single Leg Press 2 sets of 10 with 5 mini-bounces with 62# slow eccentrics 8 inch step lunge/stretch 10X Bilateral  Reassessment and progress note   09/15/22:  TherEx Recumbent bike: Level 8 seat 10 x 8 minutes Leg Press: bil 150# 2  x 15, Rt LE: 87# 2 x 10 Wall squats 2 x 10 c red physio ball against back Lunging on 8 inch step with stretch Rt LE Neuromuscular Re-edu Standing on BOSU ball dome up x 2 minutes c finger tap support from bilateral UE's Single leg dead lift 10# x3 (difficulty with balance and pt reporting increased pain Walking lunges 30 feet x 2      PATIENT EDUCATION:  Education details: HEP, POC Person educated: Patient Education method: Programmer, multimedia, Demonstration, Verbal cues, and Handouts Education comprehension: verbalized understanding, returned demonstration, and verbal cues required  HOME EXERCISE PROGRAM: Access Code: [email or web address] URL: [email or web address] Date: 07/29/2022 Prepared by: Narda Amber  Exercises - Supine Quad Set on Towel Roll  - 2-3 x daily - 7 x weekly - 2 sets - 10 reps - 5 seconds hold - Supine Active Straight Leg Raise  - 2-3 x daily - 7 x weekly - 2 sets - 10 reps - Seated Long Arc Quad  - 2-3 x daily - 7 x weekly - 2 sets - 10 reps - 3 seconds hold - Sit to Stand  - 2-3 x daily - 7 x weekly - 10 reps  ASSESSMENT:  CLINICAL IMPRESSION: Pt arriving today reporting no pain at rest. Pt reporting 3/10 pain with certain exercises this visit and pt with evidence of muscle fatigue. Pt has improved his ROM since beginning therapy, but is still progressing with Rt LE quad strength in order to return to work safely. Continue skilled PT to maximize pt's function.   OBJECTIVE IMPAIRMENTS: decreased balance, decreased mobility, difficulty walking, decreased ROM, decreased strength, and pain.   ACTIVITY LIMITATIONS: lifting, standing, squatting, and stairs  PARTICIPATION LIMITATIONS: community activity, occupation, and yard work  PERSONAL FACTORS: 1 comorbidity: HTN are also affecting patient's functional outcome.   REHAB POTENTIAL: Good  CLINICAL DECISION MAKING: Stable/uncomplicated  EVALUATION COMPLEXITY: Low   GOALS: Goals reviewed with patient?  Yes  SHORT TERM GOALS: (target date for Short term goals are 3 weeks 08/21/22)   1.  Patient will demonstrate independent use of home exercise program to maintain progress from in clinic treatments.  Goal status: Met 08/21/2022  LONG TERM GOALS: (target dates for all long term goals are 12 total  weeks  10/23/22 )   1. Patient will demonstrate/report pain at worst less than or equal to 2/10 to facilitate minimal limitation in daily activity secondary to pain symptoms.  Goal status: On Going 10/13/2022   2. Patient will demonstrate independent use of home exercise program to facilitate ability to maintain/progress functional gains from skilled physical therapy services.  Goal status: On Going 10/13/2022      3.  Patient will demonstrate Rt LE MMT 5/5 throughout to faciltiate usual transfers, stairs, squatting at [facility] for daily life.   Goal status: On Going 10/13/2022   4.  Patient will be able to navigate 1 flight of stairs with no railing with step over step gait pattern.  Goal status: On Going 10/13/2022   5.  Pt will be able to demonstrate full squat with no pain in his Rt knee.  Goal status: On Going 10/13/22    PLAN:  PT FREQUENCY: 2x/week  PT DURATION: 4 additional weeks (to Oct 23, 2022)  PLANNED INTERVENTIONS: Therapeutic exercises, Therapeutic activity, Neuro Muscular re-education, Balance training, Gait training, Patient/Family education, Joint mobilization, Stair training, DME instructions, Dry Needling, Electrical stimulation, Traction, Cryotherapy, vasopneumatic deviceMoist heat, Taping, Ultrasound, Ionotophoresis 4mg /ml Dexamethasone, and Manual therapy.  All included unless contraindicated  PLAN FOR NEXT SESSION:   Continue quadriceps strength progressions.  Next MD visit:    Narda Amber, PT, MPT 10/13/22 12:37 PM   10/13/22 12:37 PM

## 2022-10-20 ENCOUNTER — Encounter: Payer: BC Managed Care – PPO | Admitting: Physical Therapy

## 2022-10-22 ENCOUNTER — Encounter: Payer: Self-pay | Admitting: Rehabilitative and Restorative Service Providers"

## 2022-10-22 ENCOUNTER — Ambulatory Visit: Payer: Worker's Compensation | Admitting: Rehabilitative and Restorative Service Providers"

## 2022-10-22 DIAGNOSIS — R600 Localized edema: Secondary | ICD-10-CM

## 2022-10-22 DIAGNOSIS — M6281 Muscle weakness (generalized): Secondary | ICD-10-CM | POA: Diagnosis not present

## 2022-10-22 DIAGNOSIS — M25561 Pain in right knee: Secondary | ICD-10-CM

## 2022-10-22 DIAGNOSIS — R262 Difficulty in walking, not elsewhere classified: Secondary | ICD-10-CM

## 2022-10-22 NOTE — Therapy (Signed)
OUTPATIENT PHYSICAL THERAPY TREATMENT/RE-CERTIFICATION    Patient Name: Jeffrey Walters MRN: [id number] DOB:11-17-1965, 57 y.o., male Today's Date: 10/22/2022  END OF SESSION:   PT End of Session - 10/22/22 1315     Visit Number 14    Number of Visits 20    Date for PT Re-Evaluation 11/20/22    Authorization Type WC - 12 visits approved from 12/22    Authorization - Visit Number 14    Authorization - Number of Visits 24    Progress Note Due on Visit 24    PT Start Time 1104    PT Stop Time 1146    PT Time Calculation (min) 42 min    Activity Tolerance Patient tolerated treatment well;Patient limited by fatigue    Behavior During Therapy Jeffrey Walters for tasks assessed/performed               Past Medical History:  Diagnosis Date   Hypertension    Past Surgical History:  Procedure Laterality Date   KNEE SURGERY     Patient Active Problem List   Diagnosis Date Noted   Traumatic arthritis of right knee 06/26/2022   Primary hypertension 04/22/2022   Obesity (BMI 30-39.9) 04/22/2022     THERAPY DIAG:  Difficulty in walking, not elsewhere classified  Localized edema  Muscle weakness (generalized)  Acute pain of right knee  PCP: Jeffrey Walters, Jeffrey Citrin, NP    REFERRING PROVIDER:  Cammy Copa, MD  REFERRING DIAG: [phone] (ICD-10-CM) - Acute pain of right knee M12.561 (ICD-10-CM) - Traumatic arthritis of right knee  THERAPY DIAG:  Acute pain of right knee  Muscle weakness (generalized)  Difficulty in walking, not elsewhere classified  Localized edema  Rationale for Evaluation and Treatment: Rehabilitation  ONSET DATE: 05/27/22 at work   SUBJECTIVE STATEMENT: Jeffrey Walters notes he has been walking 4-5 blocks for exercise and compliance with his HEP has been consistent 3X/week.  Knee stability remains an issue.  Due to an insurance authorization delay, he is just getting started back with his supervised PT (gap between 09/17/2022 and 10/12/3032 where Jeffrey Walters was  doing his exercises independently).  PERTINENT HISTORY: HTN  PAIN:  NPRS scale: 0-4/10 this week Pain location: Rt Behind knee cap Pain description: achy, throbbing Aggravating factors: sleeping, taking dog out, quick movements Relieving factors: ice, heat  PRECAUTIONS: None  WEIGHT BEARING RESTRICTIONS: No  FALLS:  Has patient fallen in last 6 months? Yes. Number of falls 1 tripped over hose at work  LIVING ENVIRONMENT: Lives with: lives with their family and lives with their spouse Lives in: House/apartment Stairs: Yes: External: 2 steps; on left going up Has following equipment at home: None  OCCUPATION: industrial saw supervisor  PLOF: Independent  PATIENT GOALS: Be able to walk dog without pain, return to work with out pain  Next MD visit: 11/16/22   OBJECTIVE:   DIAGNOSTIC FINDINGS:  07/11/22: Compared to 08/27/2009:   1. Interval ACL reconstruction. The graft fibers appear intact. 2. New high-grade radial tear within the mid transverse portion of the posterior horn of the medial meniscus with additional superior and inferior articular surface tears of the root of the posterior horn of the medial meniscus. 3. Chronic avulsion of the biceps femoris tendon insertion on the proximal fibula. 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 4. Possible interval repair of the prior full-thickness tear of the iliotibial band insertion. There is attenuation of the distal iliotibial band. 5. Chronic partial-thickness tearing of the popliteus tendon insertion on  the lateral femoral condyle. 6. 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 7. Mild joint effusion. Small Baker's cyst.  MRI scan which shows possibly new degenerative meniscal tear on the medial side in the setting of significant medial compartment arthritis along with visible ACL but torn chronically lateral collateral ligament.     PATIENT SURVEYS:  10/22/2022 FOTO 49 (Down from 09/17/2022 with only 2 PT visits during this time)  09/17/2022 FOTO 59 (Goal met)  08/21/2022 FOTO 42  EVAL: FOTO intake:  40 (59 Goal in 15 visits)   EDEMA:  EVAL: Circumferential: Rt: 47.5 centimeters    Left 43.5 centimeters  PALPATION: 07/29/22: Medial joint line tenderness  LOWER EXTREMITY ROM:   ROM Right 07/29/22 Left 07/29/22 Rt 08/11/22 Right 08/21/2022 Rt 09/09/22 Rt 09/15/22 Right 09/17/2022  Knee flexion 110 130 118 115 118 118 133  Knee extension 0 0 0 0 0 0 0   (Blank rows = not tested)  LOWER EXTREMITY MMT:  MMT Right eval Left eval Left/Right in pounds 08/21/2022 Right in pounds 09/17/2022  Knee flexion  5/5 118.6/25.1 80.8 pounds * with 4/10 pain  Knee extension  5/5     (Blank rows = not tested)  LOWER EXTREMITY SPECIAL TESTS:  07/29/22: Knee special tests: Lachman Test: negative  FUNCTIONAL TESTS:  07/29/22:  5 time sit to stand: 28 seconds with UE support 10/13/22:   GAIT: Distance walked: 40 feet level surface Assistive device utilized: None Level of assistance: Complete Independence Comments: antalgic gait, limp on the Rt LE with decreased weight bearing, pt wearing a donjoy brace on his Rt knee   TODAY'S TREATMENT                                                                          DATE: 10/22/2022 Recumbent bike Seat 8, Resistance 6-13 for 8 minutes Seated straight leg raises 3 sets of 10 for 3 seconds 2#  Functional Activities: Double Leg Press 2 sets of 15 with 168# slow eccentrics Single Leg Press 2 sets of 10 with 87# slow eccentrics Step-down off 4 inch step 10X slow eccentrics Bilateral  Neuromuscular re-education: Single leg balance, slight bend in knee 5X 20 seconds bilateral   10/13/22:  TherEx Recumbent bike: Level 8 seat 10 x 8 minutes Leg Press: bil 168# 2 x 15, Rt LE: 87# x 10, 93# x 10 Rt LE step downs on 6 inch step 3 x 10 c finger tap support TRX squats 3 x 10 Lunge hip flexor  stretch x 2 holding 30 sec  Pt discussed purchasing a TRX similar device for home use.    09/17/2022 Seated straight leg raises 4 sets of 5 for 3 seconds 1# Ball Squats with [employer], limited range 2 sets of 10 with 5 mini-bounces  Functional Activities: Double Leg Press 15 with 5 mini-bounces with 150# slow eccentrics Single Leg Press 2 sets of 10 with 5 mini-bounces with 62# slow eccentrics 8 inch step lunge/stretch 10X Bilateral  Reassessment and progress note   PATIENT EDUCATION:  Education details: HEP, POC Person educated: Patient Education method: Explanation, Demonstration, Verbal cues, and Handouts Education comprehension: verbalized understanding, returned demonstration, and verbal cues required  HOME EXERCISE PROGRAM: Access Code: [email or web address] URL: [email or web address] Date: 07/29/2022 Prepared by: Narda Amber  Exercises - Supine Quad Set on Towel Roll  - 2-3 x daily - 7 x weekly - 2 sets - 10 reps - 5 seconds hold - Supine Active Straight Leg Raise  - 2-3 x daily - 7 x weekly - 2 sets - 10 reps - Seated Long Arc Quad  - 2-3 x daily - 7 x weekly - 2 sets - 10 reps - 3 seconds hold - Sit to Stand  - 2-3 x daily - 7 x weekly - 10 reps  ASSESSMENT:  CLINICAL IMPRESSION: Brandonjames was independent for almost 4 weeks while we waited on insurance authorization to continue his supervised PT.  Self-reported function is slightly lower as compared to before the delay.  Caydn has improved his walking endurance, although his right knee shakes and wants to give way with fatigue.  He will benefit from continued strength work as recommended at his 09/17/2022 reassessment/progress note (only 2 visits since that time).  His prognosis to meet long-term goals is good with the recommended POC.  OBJECTIVE IMPAIRMENTS: decreased balance, decreased mobility, difficulty walking, decreased ROM, decreased strength, and pain.   ACTIVITY LIMITATIONS: lifting, standing,  squatting, and stairs  PARTICIPATION LIMITATIONS: community activity, occupation, and yard work  PERSONAL FACTORS: 1 comorbidity: HTN are also affecting patient's functional outcome.   REHAB POTENTIAL: Good  CLINICAL DECISION MAKING: Stable/uncomplicated  EVALUATION COMPLEXITY: Low   GOALS: Goals reviewed with patient? Yes  SHORT TERM GOALS: (target date for Short term goals are 3 weeks 08/21/22)   1.  Patient will demonstrate independent use of home exercise program to maintain progress from in clinic treatments.  Goal status: Met 08/21/2022  LONG TERM GOALS: (target dates for all long term goals are 16 total  weeks  11/20/22 )   1. Patient will demonstrate/report pain at worst less than or equal to 2/10 to facilitate minimal limitation in daily activity secondary to pain symptoms.  Goal status: On Going 10/22/2022   2. Patient will demonstrate independent use of home exercise program to facilitate ability to maintain/progress functional gains from skilled physical therapy services.  Goal status: On Going 10/22/2022      3.  Patient will demonstrate Rt LE MMT 5/5 throughout to faciltiate usual transfers, stairs, squatting at [facility] for daily life.   Goal status: On Going 10/22/2022   4.  Patient will be able to navigate 1 flight of stairs with no railing with step over step gait pattern.  Goal status: On Going 10/22/2022   5.  Pt will be able to demonstrate full squat with no pain in his Rt knee.  Goal status: On Going 10/22/22    PLAN:  PT FREQUENCY: 2x/week  PT DURATION: 4 additional weeks (to November 20, 2022)  PLANNED INTERVENTIONS: Therapeutic exercises, Therapeutic activity, Neuro Muscular re-education, Balance training, Gait training, Patient/Family education, Joint mobilization, Stair training, DME instructions, Dry Needling, Electrical stimulation, Traction, Cryotherapy, vasopneumatic deviceMoist heat, Taping, Ultrasound, Ionotophoresis 4mg /ml Dexamethasone, and Manual  therapy.  All included unless contraindicated  PLAN FOR NEXT SESSION:   Continue quadriceps strength progressions.  Strength emphasis (quadriceps, hip abductors, calves)  Next MD visit: 11/16/2022   Cherlyn Cushing PT, MPT 10/22/22 1:28 PM   10/22/22 1:28 PM

## 2022-10-27 ENCOUNTER — Ambulatory Visit (INDEPENDENT_AMBULATORY_CARE_PROVIDER_SITE_OTHER): Payer: Worker's Compensation | Admitting: Physical Therapy

## 2022-10-27 ENCOUNTER — Encounter: Payer: Self-pay | Admitting: Physical Therapy

## 2022-10-27 DIAGNOSIS — M25561 Pain in right knee: Secondary | ICD-10-CM | POA: Diagnosis not present

## 2022-10-27 DIAGNOSIS — R600 Localized edema: Secondary | ICD-10-CM | POA: Diagnosis not present

## 2022-10-27 DIAGNOSIS — M6281 Muscle weakness (generalized): Secondary | ICD-10-CM

## 2022-10-27 DIAGNOSIS — R262 Difficulty in walking, not elsewhere classified: Secondary | ICD-10-CM | POA: Diagnosis not present

## 2022-10-27 NOTE — Therapy (Signed)
OUTPATIENT PHYSICAL THERAPY TREATMENT   Patient Name: Jeffrey Walters MRN: [id number] DOB:04-Sep-1965, 57 y.o., male Today's Date: 10/27/2022  END OF SESSION:   PT End of Session - 10/27/22 0954     Visit Number 15    Number of Visits 20    Date for PT Re-Evaluation 11/20/22    Authorization Type WC - 12 visits approved from 12/22    Authorization - Visit Number 15    Authorization - Number of Visits 24    Progress Note Due on Visit 24    PT Start Time 0930    PT Stop Time 1010    PT Time Calculation (min) 40 min    Activity Tolerance Patient tolerated treatment well;Patient limited by fatigue    Behavior During Therapy [facility] for tasks assessed/performed               Past Medical History:  Diagnosis Date   Hypertension    Past Surgical History:  Procedure Laterality Date   KNEE SURGERY     Patient Active Problem List   Diagnosis Date Noted   Traumatic arthritis of right knee 06/26/2022   Primary hypertension 04/22/2022   Obesity (BMI 30-39.9) 04/22/2022     THERAPY DIAG:  Difficulty in walking, not elsewhere classified  Localized edema  Muscle weakness (generalized)  Acute pain of right knee  PCP: Ngetich, Donalee Citrin, NP    REFERRING PROVIDER:  Cammy Copa, MD  REFERRING DIAG: [phone] (ICD-10-CM) - Acute pain of right knee M12.561 (ICD-10-CM) - Traumatic arthritis of right knee  THERAPY DIAG:  Acute pain of right knee  Muscle weakness (generalized)  Difficulty in walking, not elsewhere classified  Localized edema  Rationale for Evaluation and Treatment: Rehabilitation  ONSET DATE: 05/27/22 at work   SUBJECTIVE STATEMENT: Pt stating he hasn't walked much this past week due to rain. Pt arriving today with pain of 1/10 in his Rt knee.   PERTINENT HISTORY: HTN  PAIN:  NPRS scale: 1/10  Pain location: Rt Behind knee cap Pain description: achy, throbbing Aggravating factors: sleeping, taking dog out, quick movements Relieving  factors: ice, heat  PRECAUTIONS: None  WEIGHT BEARING RESTRICTIONS: No  FALLS:  Has patient fallen in last 6 months? Yes. Number of falls 1 tripped over hose at work  LIVING ENVIRONMENT: Lives with: lives with their family and lives with their spouse Lives in: House/apartment Stairs: Yes: External: 2 steps; on left going up Has following equipment at home: None  OCCUPATION: industrial saw supervisor  PLOF: Independent  PATIENT GOALS: Be able to walk dog without pain, return to work with out pain  Next MD visit: 11/16/22   OBJECTIVE:   DIAGNOSTIC FINDINGS:  07/11/22: Compared to 08/27/2009:   1. Interval ACL reconstruction. The graft fibers appear intact. 2. New high-grade radial tear within the mid transverse portion of the posterior horn of the medial meniscus with additional superior and inferior articular surface tears of the root of the posterior horn of the medial meniscus. 3. Chronic avulsion of the biceps femoris tendon insertion on the proximal fibula. 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 4. Possible interval repair of the prior full-thickness tear of the iliotibial band insertion. There is attenuation of the distal iliotibial band. 5. Chronic partial-thickness tearing of the popliteus tendon insertion on the lateral femoral condyle. 6. 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 7. Mild joint effusion. Small Baker's cyst.  MRI scan which shows possibly new degenerative  meniscal tear on the medial side in the setting of significant medial compartment arthritis along with visible ACL but torn chronically lateral collateral ligament.    PATIENT SURVEYS:  10/22/2022 FOTO 49 (Down from 09/17/2022 with only 2 PT visits during this time)  09/17/2022 FOTO 59 (Goal met)  08/21/2022 FOTO 42  EVAL: FOTO intake:  40 (59 Goal in 15 visits)   EDEMA:  EVAL: Circumferential: Rt: 47.5  centimeters    Left 43.5 centimeters  PALPATION: 07/29/22: Medial joint line tenderness  LOWER EXTREMITY ROM:   ROM Right 07/29/22 Left 07/29/22 Rt 08/11/22 Right 08/21/2022 Rt 09/09/22 Rt 09/15/22 Right 09/17/2022  Knee flexion 110 130 118 115 118 118 133  Knee extension 0 0 0 0 0 0 0   (Blank rows = not tested)  LOWER EXTREMITY MMT:  MMT Right eval Left eval Left/Right in pounds 08/21/2022 Right in pounds 09/17/2022  Knee flexion  5/5 118.6/25.1 80.8 pounds * with 4/10 pain  Knee extension  5/5     (Blank rows = not tested)  LOWER EXTREMITY SPECIAL TESTS:  07/29/22: Knee special tests: Lachman Test: negative  FUNCTIONAL TESTS:  07/29/22:  5 time sit to stand: 28 seconds with UE support   GAIT: Distance walked: 40 feet level surface Assistive device utilized: None Level of assistance: Complete Independence Comments: antalgic gait, limp on the Rt LE with decreased weight bearing, pt wearing a donjoy brace on his Rt knee   TODAY'S TREATMENT                                                                          DATE: 10/13/22:  TherEx Nustep: level 10 x 11 minutes LE only Leg Press: Rt LE: 100# 3 x 10 Rt LE step downs on 6 inch step 3 x 10 c finger tap support Rocker board: side to side x 2 minutes with finger tap support Rocker board: df/pf x 2 minutes with finger tap support NMR:  BLAZE PODS: SLR on Rt with left toe down for support while standing on [employer] while tapping 4 pods with bil UE's in window x 5 trials average time 39 taps Blaze pods: SLS Rt LE on Airex mat while tapping with left LE on 4 pods placed in semi circle around mat. Pt having to place his foot on the mat in between each tap for balance/support, # trials with average taps 20.   Pt discussed purchasing a TRX similar device for home use.       10/22/2022 Recumbent bike Seat 8, Resistance 6-13 for 8 minutes Seated straight leg raises 3 sets of 10 for 3 seconds 2#  Functional Activities: Double Leg Press 2  sets of 15 with 168# slow eccentrics Single Leg Press 2 sets of 10 with 87# slow eccentrics Step-down off 4 inch step 10X slow eccentrics Bilateral  Neuromuscular re-education: Single leg balance, slight bend in knee 5X 20 seconds bilateral   10/13/22:  TherEx Recumbent bike: Level 8 seat 10 x 8 minutes Leg Press: bil 168# 2 x 15, Rt LE: 87# x 10, 93# x 10 Rt LE step downs on 6 inch step 3 x 10 c finger tap support TRX squats 3 x 10 Lunge hip flexor stretch  x 2 holding 30 sec  Pt discussed purchasing a TRX similar device for home use.    09/17/2022 Seated straight leg raises 4 sets of 5 for 3 seconds 1# Ball Squats with [employer], limited range 2 sets of 10 with 5 mini-bounces  Functional Activities: Double Leg Press 15 with 5 mini-bounces with 150# slow eccentrics Single Leg Press 2 sets of 10 with 5 mini-bounces with 62# slow eccentrics 8 inch step lunge/stretch 10X Bilateral  Reassessment and progress note   PATIENT EDUCATION:  Education details: HEP, POC Person educated: Patient Education method: Programmer, multimedia, Demonstration, Verbal cues, and Handouts Education comprehension: verbalized understanding, returned demonstration, and verbal cues required  HOME EXERCISE PROGRAM: Access Code: [email or web address] URL: [email or web address] Date: 07/29/2022 Prepared by: Narda Amber  Exercises - Supine Quad Set on Towel Roll  - 2-3 x daily - 7 x weekly - 2 sets - 10 reps - 5 seconds hold - Supine Active Straight Leg Raise  - 2-3 x daily - 7 x weekly - 2 sets - 10 reps - Seated Long Arc Quad  - 2-3 x daily - 7 x weekly - 2 sets - 10 reps - 3 seconds hold - Sit to Stand  - 2-3 x daily - 7 x weekly - 10 reps  ASSESSMENT:  CLINICAL IMPRESSION: Pt reporting less walking this past week due to weather. Pt still reporting intermittent Rt knee fatigue and buckling at times with certain act ivies and walking. Treatment today focusing on LE strengthening and dynamic  standing balance and SLS activities. Continue skilled PT.   OBJECTIVE IMPAIRMENTS: decreased balance, decreased mobility, difficulty walking, decreased ROM, decreased strength, and pain.   ACTIVITY LIMITATIONS: lifting, standing, squatting, and stairs  PARTICIPATION LIMITATIONS: community activity, occupation, and yard work  PERSONAL FACTORS: 1 comorbidity: HTN are also affecting patient's functional outcome.   REHAB POTENTIAL: Good  CLINICAL DECISION MAKING: Stable/uncomplicated  EVALUATION COMPLEXITY: Low   GOALS: Goals reviewed with patient? Yes  SHORT TERM GOALS: (target date for Short term goals are 3 weeks 08/21/22)   1.  Patient will demonstrate independent use of home exercise program to maintain progress from in clinic treatments.  Goal status: Met 08/21/2022  LONG TERM GOALS: (target dates for all long term goals are 16 total  weeks  11/20/22 )   1. Patient will demonstrate/report pain at worst less than or equal to 2/10 to facilitate minimal limitation in daily activity secondary to pain symptoms.  Goal status: On Going 10/22/2022   2. Patient will demonstrate independent use of home exercise program to facilitate ability to maintain/progress functional gains from skilled physical therapy services.  Goal status: On Going 10/22/2022      3.  Patient will demonstrate Rt LE MMT 5/5 throughout to faciltiate usual transfers, stairs, squatting at [facility] for daily life.   Goal status: On Going 10/22/2022   4.  Patient will be able to navigate 1 flight of stairs with no railing with step over step gait pattern.  Goal status: On Going 10/22/2022   5.  Pt will be able to demonstrate full squat with no pain in his Rt knee.  Goal status: On Going 10/22/22    PLAN:  PT FREQUENCY: 2x/week  PT DURATION: 4 additional weeks (to November 20, 2022)  PLANNED INTERVENTIONS: Therapeutic exercises, Therapeutic activity, Neuro Muscular re-education, Balance training, Gait training, Patient/Family  education, Joint mobilization, Stair training, DME instructions, Dry Needling, Electrical stimulation, Traction, Cryotherapy, vasopneumatic deviceMoist heat, Taping, Ultrasound, Ionotophoresis 4mg /ml Dexamethasone, and  Manual therapy.  All included unless contraindicated  PLAN FOR NEXT SESSION:   Continue quadriceps strength progressions.  Strength emphasis (quadriceps, hip abductors, calves)  Next MD visit: 11/16/2022   Cherlyn Cushing PT, MPT 10/27/22 9:59 AM   10/27/22 9:59 AM

## 2022-10-29 ENCOUNTER — Encounter: Payer: Self-pay | Admitting: Rehabilitative and Restorative Service Providers"

## 2022-10-29 ENCOUNTER — Ambulatory Visit: Payer: Worker's Compensation | Admitting: Rehabilitative and Restorative Service Providers"

## 2022-10-29 DIAGNOSIS — R600 Localized edema: Secondary | ICD-10-CM

## 2022-10-29 DIAGNOSIS — R262 Difficulty in walking, not elsewhere classified: Secondary | ICD-10-CM | POA: Diagnosis not present

## 2022-10-29 DIAGNOSIS — M6281 Muscle weakness (generalized): Secondary | ICD-10-CM

## 2022-10-29 DIAGNOSIS — M25561 Pain in right knee: Secondary | ICD-10-CM

## 2022-10-29 NOTE — Therapy (Signed)
OUTPATIENT PHYSICAL THERAPY TREATMENT   Patient Name: Jeffrey Walters MRN: [id number] DOB:April 12, 1966, 57 y.o., male Today's Date: 10/29/2022  END OF SESSION:   PT End of Session - 10/29/22 0938     Visit Number 16    Number of Visits 20    Date for PT Re-Evaluation 11/20/22    Authorization Type WC - 12 visits approved from 12/22    Authorization - Visit Number 16    Authorization - Number of Visits 24    Progress Note Due on Visit 24    PT Start Time 0930    PT Stop Time 1013    PT Time Calculation (min) 43 min    Activity Tolerance Patient tolerated treatment well;Patient limited by fatigue;No increased pain    Behavior During Therapy [facility] for tasks assessed/performed              Past Medical History:  Diagnosis Date   Hypertension    Past Surgical History:  Procedure Laterality Date   KNEE SURGERY     Patient Active Problem List   Diagnosis Date Noted   Traumatic arthritis of right knee 06/26/2022   Primary hypertension 04/22/2022   Obesity (BMI 30-39.9) 04/22/2022     THERAPY DIAG:  Difficulty in walking, not elsewhere classified  Localized edema  Muscle weakness (generalized)  Acute pain of right knee  PCP: Ngetich, Donalee Citrin, NP    REFERRING PROVIDER:  Cammy Copa, MD  REFERRING DIAG: [phone] (ICD-10-CM) - Acute pain of right knee M12.561 (ICD-10-CM) - Traumatic arthritis of right knee  THERAPY DIAG:  Acute pain of right knee  Muscle weakness (generalized)  Difficulty in walking, not elsewhere classified  Localized edema  Rationale for Evaluation and Treatment: Rehabilitation  ONSET DATE: 05/27/22 at work   SUBJECTIVE STATEMENT: Jeffrey Walters continues to do a good job with his HEP.  He is scheduled for a FCE for return to work.  PERTINENT HISTORY: HTN  PAIN:  NPRS scale: 1-3/10  Pain location: Rt Behind knee cap Pain description: achy, throbbing Aggravating factors: sleeping, taking dog out, quick movements Relieving  factors: ice, heat  PRECAUTIONS: None  WEIGHT BEARING RESTRICTIONS: No  FALLS:  Has patient fallen in last 6 months? Yes. Number of falls 1 tripped over hose at work  LIVING ENVIRONMENT: Lives with: lives with their family and lives with their spouse Lives in: House/apartment Stairs: Yes: External: 2 steps; on left going up Has following equipment at home: None  OCCUPATION: industrial saw supervisor  PLOF: Independent  PATIENT GOALS: Be able to walk dog without pain, return to work with out pain  Next MD visit: 11/16/22   OBJECTIVE:   DIAGNOSTIC FINDINGS:  07/11/22: Compared to 08/27/2009:   1. Interval ACL reconstruction. The graft fibers appear intact. 2. New high-grade radial tear within the mid transverse portion of the posterior horn of the medial meniscus with additional superior and inferior articular surface tears of the root of the posterior horn of the medial meniscus. 3. Chronic avulsion of the biceps femoris tendon insertion on the proximal fibula. 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 4. Possible interval repair of the prior full-thickness tear of the iliotibial band insertion. There is attenuation of the distal iliotibial band. 5. Chronic partial-thickness tearing of the popliteus tendon insertion on the lateral femoral condyle. 6. 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 7. Mild joint effusion. Small Baker's cyst.  MRI scan which shows possibly new degenerative meniscal tear  on the medial side in the setting of significant medial compartment arthritis along with visible ACL but torn chronically lateral collateral ligament.    PATIENT SURVEYS:  10/22/2022 FOTO 49 (Down from 09/17/2022 with only 2 PT visits during this time)  09/17/2022 FOTO 59 (Goal met)  08/21/2022 FOTO 42  EVAL: FOTO intake:  40 (59 Goal in 15 visits)   EDEMA:  EVAL: Circumferential: Rt: 47.5  centimeters    Left 43.5 centimeters  PALPATION: 07/29/22: Medial joint line tenderness  LOWER EXTREMITY ROM:   ROM Right 07/29/22 Left 07/29/22 Rt 08/11/22 Right 08/21/2022 Rt 09/09/22 Rt 09/15/22 Right 09/17/2022  Knee flexion 110 130 118 115 118 118 133  Knee extension 0 0 0 0 0 0 0   (Blank rows = not tested)  LOWER EXTREMITY MMT:  MMT Right eval Left eval Left/Right in pounds 08/21/2022 Right in pounds 09/17/2022  Knee flexion  5/5 118.6/25.1 80.8 pounds * with 4/10 pain  Knee extension  5/5     (Blank rows = not tested)  LOWER EXTREMITY SPECIAL TESTS:  07/29/22: Knee special tests: Lachman Test: negative  FUNCTIONAL TESTS:  07/29/22:  5 time sit to stand: 28 seconds with UE support   GAIT: Distance walked: 40 feet level surface Assistive device utilized: None Level of assistance: Complete Independence Comments: antalgic gait, limp on the Rt LE with decreased weight bearing, pt wearing a donjoy brace on his Rt knee   TODAY'S TREATMENT                                                                          DATE: 10/29/2022 Recumbent bike Seat 8, Resistance 6-9 for 6 minutes Seated straight leg raises 4 sets of 10 for 3 seconds 3#  Functional Activities: Double Leg Press 2 sets of 20 with 168# slow eccentrics Single Leg Press 2 sets of 10 with 100# slow eccentrics Step-down off 4 inch step 10X slow eccentrics Bilateral  Neuromuscular re-education: Single leg balance, slight bend in knee 10X 10 seconds bilateral   10/27/22  TherEx Nustep: level 10 x 11 minutes LE only Leg Press: Rt LE: 100# 3 x 10 Rt LE step downs on 6 inch step 3 x 10 c finger tap support Rocker board: side to side x 2 minutes with finger tap support Rocker board: df/pf x 2 minutes with finger tap support NMR:  BLAZE PODS: SLR on Rt with left toe down for support while standing on Airex while tapping 4 pods with bil UE's in window x 5 trials average time 39 taps Blaze pods: SLS Rt LE on Airex mat  while tapping with left LE on 4 pods placed in semi circle around mat. Pt having to place his foot on the mat in between each tap for balance/support, # trials with average taps 20.   Pt discussed purchasing a TRX similar device for home use.    10/22/2022 Recumbent bike Seat 8, Resistance 6-13 for 8 minutes Seated straight leg raises 3 sets of 10 for 3 seconds 2#  Functional Activities: Double Leg Press 2 sets of 15 with 168# slow eccentrics Single Leg Press 2 sets of 10 with 87# slow eccentrics Step-down off 4 inch step 10X slow eccentrics Bilateral  Neuromuscular re-education: Single leg balance, slight bend in knee 5X 20 seconds bilateral   PATIENT EDUCATION:  Education details: HEP, POC Person educated: Patient Education method: Programmer, multimedia, Demonstration, Verbal cues, and Handouts Education comprehension: verbalized understanding, returned demonstration, and verbal cues required  HOME EXERCISE PROGRAM: Access Code: [email or web address] URL: [email or web address] Date: 07/29/2022 Prepared by: Narda Amber  Exercises - Supine Quad Set on Towel Roll  - 2-3 x daily - 7 x weekly - 2 sets - 10 reps - 5 seconds hold - Supine Active Straight Leg Raise  - 2-3 x daily - 7 x weekly - 2 sets - 10 reps - Seated Long Arc Quad  - 2-3 x daily - 7 x weekly - 2 sets - 10 reps - 3 seconds hold - Sit to Stand  - 2-3 x daily - 7 x weekly - 10 reps  ASSESSMENT:  CLINICAL IMPRESSION: Dashiell continues to give excellent effort with his supervised physical therapy.  Muscle fatigue (quadriceps) is noted later in his treatment time and with single-leg activities on the right side.  Continued strength and stability work is recommended to prepare [place] for return to work.  He has a FCE schedule next week as well.   OBJECTIVE IMPAIRMENTS: decreased balance, decreased mobility, difficulty walking, decreased ROM, decreased strength, and pain.   ACTIVITY LIMITATIONS: lifting, standing,  squatting, and stairs  PARTICIPATION LIMITATIONS: community activity, occupation, and yard work  PERSONAL FACTORS: 1 comorbidity: HTN are also affecting patient's functional outcome.   REHAB POTENTIAL: Good  CLINICAL DECISION MAKING: Stable/uncomplicated  EVALUATION COMPLEXITY: Low   GOALS: Goals reviewed with patient? Yes  SHORT TERM GOALS: (target date for Short term goals are 3 weeks 08/21/22)   1.  Patient will demonstrate independent use of home exercise program to maintain progress from in clinic treatments.  Goal status: Met 08/21/2022  LONG TERM GOALS: (target dates for all long term goals are 16 total  weeks  11/20/22 )   1. Patient will demonstrate/report pain at worst less than or equal to 2/10 to facilitate minimal limitation in daily activity secondary to pain symptoms.  Goal status: On Going 10/29/2022   2. Patient will demonstrate independent use of home exercise program to facilitate ability to maintain/progress functional gains from skilled physical therapy services.  Goal status: On Going 10/29/2022    3.  Patient will demonstrate Rt LE MMT 5/5 throughout to faciltiate usual transfers, stairs, squatting at [facility] for daily life.   Goal status: On Going 10/29/2022   4.  Patient will be able to navigate 1 flight of stairs with no railing with step over step gait pattern.  Goal status: On Going 10/29/2022   5.  Pt will be able to demonstrate full squat with no pain in his Rt knee.  Goal status: On Going 10/29/22    PLAN:  PT FREQUENCY: 2x/week  PT DURATION: 4 additional weeks (to November 20, 2022)  PLANNED INTERVENTIONS: Therapeutic exercises, Therapeutic activity, Neuro Muscular re-education, Balance training, Gait training, Patient/Family education, Joint mobilization, Stair training, DME instructions, Dry Needling, Electrical stimulation, Traction, Cryotherapy, vasopneumatic deviceMoist heat, Taping, Ultrasound, Ionotophoresis 4mg /ml Dexamethasone, and Manual  therapy.  All included unless contraindicated  PLAN FOR NEXT SESSION:   Continue quadriceps strength progressions.  Strength emphasis (quadriceps, hip abductors, calves).  FCE scheduled 11/05/2022.  Next MD visit: 11/16/2022   Cherlyn Cushing PT, MPT 10/29/22 11:26 AM   10/29/22 11:26 AM

## 2022-11-03 ENCOUNTER — Encounter: Payer: BC Managed Care – PPO | Admitting: Physical Therapy

## 2022-11-05 ENCOUNTER — Encounter: Payer: BC Managed Care – PPO | Admitting: Rehabilitative and Restorative Service Providers"

## 2022-11-10 ENCOUNTER — Ambulatory Visit (INDEPENDENT_AMBULATORY_CARE_PROVIDER_SITE_OTHER): Payer: BC Managed Care – PPO | Admitting: Rehabilitative and Restorative Service Providers"

## 2022-11-10 ENCOUNTER — Encounter: Payer: Self-pay | Admitting: Rehabilitative and Restorative Service Providers"

## 2022-11-10 DIAGNOSIS — M25561 Pain in right knee: Secondary | ICD-10-CM | POA: Diagnosis not present

## 2022-11-10 DIAGNOSIS — M6281 Muscle weakness (generalized): Secondary | ICD-10-CM

## 2022-11-10 DIAGNOSIS — R262 Difficulty in walking, not elsewhere classified: Secondary | ICD-10-CM | POA: Diagnosis not present

## 2022-11-10 DIAGNOSIS — R600 Localized edema: Secondary | ICD-10-CM

## 2022-11-10 NOTE — Therapy (Addendum)
OUTPATIENT PHYSICAL THERAPY TREATMENT/PROGRESS NOTE   /DISCHARGE   Patient Name: Jeffrey Walters MRN: [id number] DOB:1966/01/22, 57 y.o., male Today's Date: 11/10/2022  Progress Note Reporting Period 07/29/2022 to 11/10/2022  See note below for Objective Data and Assessment of Progress/Goals.     END OF SESSION:   PT End of Session - 11/10/22 0942     Visit Number 17    Number of Visits 20    Date for PT Re-Evaluation 11/20/22    Authorization Type WC - 12 visits approved from 12/22    Authorization - Visit Number 17    Authorization - Number of Visits 24    Progress Note Due on Visit 24    PT Start Time 0937    PT Stop Time 1016    PT Time Calculation (min) 39 min    Activity Tolerance Patient tolerated treatment well;Patient limited by fatigue;No increased pain    Behavior During Therapy [facility] for tasks assessed/performed               Past Medical History:  Diagnosis Date   Hypertension    Past Surgical History:  Procedure Laterality Date   KNEE SURGERY     Patient Active Problem List   Diagnosis Date Noted   Traumatic arthritis of right knee 06/26/2022   Primary hypertension 04/22/2022   Obesity (BMI 30-39.9) 04/22/2022     THERAPY DIAG:  Difficulty in walking, not elsewhere classified  Localized edema  Muscle weakness (generalized)  Acute pain of right knee  PCP: Ngetich, Donalee Citrin, NP    REFERRING PROVIDER:  Cammy Copa, MD  REFERRING DIAG: [phone] (ICD-10-CM) - Acute pain of right knee M12.561 (ICD-10-CM) - Traumatic arthritis of right knee  THERAPY DIAG:  Acute pain of right knee  Muscle weakness (generalized)  Difficulty in walking, not elsewhere classified  Localized edema  Rationale for Evaluation and Treatment: Rehabilitation  ONSET DATE: 05/27/22 at work   SUBJECTIVE STATEMENT: Haleem continues to do a good job with his HEP.  He reports his FCE went well with some pain but not enough to stop him.  He is confident  with his HEP.  PERTINENT HISTORY: HTN  PAIN:  NPRS scale: 1-3/10 this week Pain location: Rt Behind knee cap Pain description: achy, throbbing Aggravating factors: Heavy physical work, taking dog out, quick movements Relieving factors: Ice, heat  PRECAUTIONS: None  WEIGHT BEARING RESTRICTIONS: No  FALLS:  Has patient fallen in last 6 months? Yes. Number of falls 1 tripped over hose at work  LIVING ENVIRONMENT: Lives with: lives with their family and lives with their spouse Lives in: House/apartment Stairs: Yes: External: 2 steps; on left going up Has following equipment at home: None  OCCUPATION: industrial saw supervisor  PLOF: Independent  PATIENT GOALS: Be able to walk dog without pain, return to work with out pain  Next MD visit: 11/16/22   OBJECTIVE:   DIAGNOSTIC FINDINGS:  07/11/22: Compared to 08/27/2009:   1. Interval ACL reconstruction. The graft fibers appear intact. 2. New high-grade radial tear within the mid transverse portion of the posterior horn of the medial meniscus with additional superior and inferior articular surface tears of the root of the posterior horn of the medial meniscus. 3. Chronic avulsion of the biceps femoris tendon insertion on the proximal fibula. 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 4. Possible interval repair of the prior full-thickness tear of the iliotibial band insertion. There is attenuation of the distal iliotibial band. 5. Chronic  partial-thickness tearing of the popliteus tendon insertion on the lateral femoral condyle. 6. 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 7. Mild joint effusion. Small Baker's cyst.  MRI scan which shows possibly new degenerative meniscal tear on the medial side in the setting of significant medial compartment arthritis along with visible ACL but torn chronically lateral collateral ligament.    PATIENT  SURVEYS:  11/10/2022 FOTO 65 (Goal met)  10/22/2022 FOTO 49 (Down from 09/17/2022 with only 2 PT visits during this time)  09/17/2022 FOTO 59 (Goal met)  08/21/2022 FOTO 42  EVAL: FOTO intake:  40 (59 Goal in 15 visits)   EDEMA:  EVAL: Circumferential: Rt: 47.5 centimeters    Left 43.5 centimeters  PALPATION: 07/29/22: Medial joint line tenderness  LOWER EXTREMITY ROM:   ROM Right 07/29/22 Left 07/29/22 Rt 08/11/22 Right 08/21/2022 Rt 09/09/22 Rt 09/15/22 Right 09/17/2022 Left/Right 11/10/2022  Knee flexion 110 130 118 115 118 118 133 130/128  Knee extension 0 0 0 0 0 0 0 0/0   (Blank rows = not tested)  LOWER EXTREMITY MMT:  MMT Right eval Left eval Left/Right in pounds 08/21/2022 Right in pounds 09/17/2022 Right in pounds 11/10/2022  Knee flexion  5/5 118.6/25.1 80.8 pounds * with 4/10 pain 86 Pounds with 4/10 pain  Knee extension  5/5      (Blank rows = not tested)  LOWER EXTREMITY SPECIAL TESTS:  07/29/22: Knee special tests: Lachman Test: negative  FUNCTIONAL TESTS:  07/29/22:  5 time sit to stand: 28 seconds with UE support   GAIT: Distance walked: 40 feet level surface Assistive device utilized: None Level of assistance: Complete Independence Comments: antalgic gait, limp on the Rt LE with decreased weight bearing, pt wearing a donjoy brace on his Rt knee   TODAY'S TREATMENT                                                                          DATE: 11/10/2022 Recumbent bike Seat 8, Resistance 6-15 for 6 minutes Seated straight leg raises 4 sets of 10 for 3 seconds 3#  Functional Activities: Double Leg Press 20 with 168# slow eccentrics Single Leg Press 10 with 100# slow eccentrics Step-down off 4 inch step 10X slow eccentrics Bilateral  Neuromuscular re-education: Single leg balance, slight bend in knee 10X 10 seconds bilateral   10/29/2022 Recumbent bike Seat 8, Resistance 6-9 for 6 minutes Seated straight leg raises 4 sets of 10 for 3 seconds 3#  Functional  Activities: Double Leg Press 2 sets of 20 with 168# slow eccentrics Single Leg Press 2 sets of 10 with 100# slow eccentrics Step-down off 4 inch step 10X slow eccentrics Bilateral  Neuromuscular re-education: Single leg balance, slight bend in knee 10X 10 seconds bilateral   10/27/22  TherEx Nustep: level 10 x 11 minutes LE only Leg Press: Rt LE: 100# 3 x 10 Rt LE step downs on 6 inch step 3 x 10 c finger tap support Rocker board: side to side x 2 minutes with finger tap support Rocker board: df/pf x 2 minutes with finger tap support NMR:  BLAZE PODS: SLR on Rt with left toe down for support while standing on Airex while tapping  4 pods with bil UE's in window x 5 trials average time 39 taps Blaze pods: SLS Rt LE on Airex mat while tapping with left LE on 4 pods placed in semi circle around mat. Pt having to place his foot on the mat in between each tap for balance/support, # trials with average taps 20.   Pt discussed purchasing a TRX similar device for home use.    PATIENT EDUCATION:  Education details: HEP, POC Person educated: Patient Education method: Programmer, multimedia, Demonstration, Verbal cues, and Handouts Education comprehension: verbalized understanding, returned demonstration, and verbal cues required  HOME EXERCISE PROGRAM: Access Code: [email or web address] URL: [email or web address] Date: 07/29/2022 Prepared by: Narda Amber  Exercises - Supine Quad Set on Towel Roll  - 2-3 x daily - 7 x weekly - 2 sets - 10 reps - 5 seconds hold - Supine Active Straight Leg Raise  - 2-3 x daily - 7 x weekly - 2 sets - 10 reps - Seated Long Arc Quad  - 2-3 x daily - 7 x weekly - 2 sets - 10 reps - 3 seconds hold - Sit to Stand  - 2-3 x daily - 7 x weekly - 10 reps  ASSESSMENT:  CLINICAL IMPRESSION: Fue continues to give excellent effort with his supervised physical therapy.  Muscle fatigue (quadriceps) is noted later in his treatment time and with single-leg activities on  the right side.  Kaymon will continue to benefit from strengthening his quadriceps muscle group long-term.  He is doing well with his current program and appears ready to transfer into more independent rehabilitation.  He would also benefit from joing a gym long-term with access to a bike and leg press.  I recommend he attend 1-2 additional visits to make sure he is comfortable with his long-term HEP before transfer into independent rehabilitation.  OBJECTIVE IMPAIRMENTS: decreased balance, decreased mobility, difficulty walking, decreased ROM, decreased strength, and pain.   ACTIVITY LIMITATIONS: lifting, standing, squatting, and stairs  PARTICIPATION LIMITATIONS: community activity, occupation, and yard work  PERSONAL FACTORS: 1 comorbidity: HTN are also affecting patient's functional outcome.   REHAB POTENTIAL: Good  CLINICAL DECISION MAKING: Stable/uncomplicated  EVALUATION COMPLEXITY: Low   GOALS: Goals reviewed with patient? Yes  SHORT TERM GOALS: (target date for Short term goals are 3 weeks 08/21/22)   1.  Patient will demonstrate independent use of home exercise program to maintain progress from in clinic treatments.  Goal status: Met 08/21/2022  LONG TERM GOALS: (target dates for all long term goals are 16 total  weeks  11/20/22 )   1. Patient will demonstrate/report pain at worst less than or equal to 2/10 to facilitate minimal limitation in daily activity secondary to pain symptoms.  Goal status: On Going 11/10/2022   2. Patient will demonstrate independent use of home exercise program to facilitate ability to maintain/progress functional gains from skilled physical therapy services.  Goal status: On Going 11/10/2022    3.  Patient will demonstrate Rt LE MMT 5/5 throughout to faciltiate usual transfers, stairs, squatting at [facility] for daily life.   Goal status: On Going 11/10/2022   4.  Patient will be able to navigate 1 flight of stairs with no railing with step over step  gait pattern.  Goal status: Partailly Met (fatigue) 11/10/2022   5.  Pt will be able to demonstrate full squat with no pain in his Rt knee.  Goal status: On Going 11/10/22    PLAN:  PT FREQUENCY: 1-2 additional visits  PT DURATION: To  November 20, 2022  PLANNED INTERVENTIONS: Therapeutic exercises, Therapeutic activity, Neuro Muscular re-education, Balance training, Gait training, Patient/Family education, Joint mobilization, Stair training, DME instructions, Dry Needling, Electrical stimulation, Traction, Cryotherapy, vasopneumatic deviceMoist heat, Taping, Ultrasound, Ionotophoresis 4mg /ml Dexamethasone, and Manual therapy.  All included unless contraindicated  PLAN FOR NEXT SESSION:   Continue quadriceps strength progressions.  Strength emphasis (quadriceps, hip abductors, calves).  FCE completed 11/05/2022.  Next MD visit: 11/16/2022   Cherlyn Cushing PT, MPT 11/10/22 3:13 PM   PHYSICAL THERAPY DISCHARGE SUMMARY  Visits from Start of Care: 17  Current functional level related to goals / functional outcomes: See note   Remaining deficits: See note   Education / Equipment: HEP  Patient goals were partially met. Patient is being discharged due to not returning since the last visit.  Chyrel Masson, PT, DPT, OCS, ATC 12/29/22  2:18 PM

## 2022-11-11 ENCOUNTER — Telehealth: Payer: Self-pay | Admitting: Rehabilitative and Restorative Service Providers"

## 2022-11-11 ENCOUNTER — Encounter: Payer: BC Managed Care – PPO | Admitting: Rehabilitative and Restorative Service Providers"

## 2022-11-11 NOTE — Telephone Encounter (Signed)
Left message regarding today's no show and reminded him of his 6/3 appointment with Dr. August Saucer and 6/6 1 PM appointment with me.

## 2022-11-16 ENCOUNTER — Ambulatory Visit: Payer: BC Managed Care – PPO | Admitting: Orthopedic Surgery

## 2022-11-19 ENCOUNTER — Encounter: Payer: BC Managed Care – PPO | Admitting: Rehabilitative and Restorative Service Providers"

## 2022-11-19 ENCOUNTER — Telehealth: Payer: Self-pay | Admitting: Rehabilitative and Restorative Service Providers"

## 2022-11-19 NOTE — Telephone Encounter (Signed)
Spoke with Jeffrey Walters about Jeffrey Walters missed appointment today.  He was under the impression that he was done with supervised PT. he follows up with the MD 11/30/2022 and is welcome to return for further rehabilitation if requested at that time.  Otherwise, Jeffrey Walters will continue Jeffrey Walters physical therapy independently and was encouraged to contact me with any questions about Jeffrey Walters independent exercises.  If I do not hear back from him by the end of the month, I will close Jeffrey Walters file and complete a discharge.

## 2022-11-27 ENCOUNTER — Ambulatory Visit: Payer: BC Managed Care – PPO | Admitting: Surgical

## 2022-11-30 ENCOUNTER — Ambulatory Visit: Payer: Worker's Compensation | Admitting: Orthopedic Surgery

## 2022-11-30 DIAGNOSIS — M12561 Traumatic arthropathy, right knee: Secondary | ICD-10-CM | POA: Diagnosis not present

## 2022-11-30 NOTE — Progress Notes (Signed)
Office Visit Note   Patient: Jeffrey Walters           Date of Birth: Nov 10, 1965           MRN: [id number] Visit Date: 11/30/2022 Requested by: Caesar Bookman, NP [address] [place],  [place] [address] PCP: Caesar Bookman, NP  Subjective: Chief Complaint  Patient presents with   Other     Follow up to review FCE    HPI: Jeffrey Walters is a 57 y.o. male who presents to the office reporting right knee pain.  Had an injury on 05/27/2022.  Has been going to therapy.  Has a history of prior injury to the knee along with pre-existing arthritis in the knee.  Chronic biceps for Morris avulsion off of the fibular head.  MRI scan demonstrated possible new meniscal tear but it is difficult to assess in the arthritic knee.  FCE demonstrated that Jeffrey Walters is capable of performing occasional lifting capabilities at the very heavy physical demand level including floor to waist at 110 waist to shoulder at 90 shoulder to overhead at 60 and to hand carry of 110.  He gave an excellent effort in the FCE..                ROS: All systems reviewed are negative as they relate to the chief complaint within the history of present illness.  Patient denies fevers or chills.  Assessment & Plan: Visit Diagnoses:  1. Traumatic arthritis of right knee     Plan: Impression is patient has reached maximal medical improvement following right knee injury.  No surgical intervention required.  He likely did sustain a new meniscal tear in the face of existing arthritis.  Knee definitely had some problems before the injury although he was very functional with it.  He has essentially regained his baseline level of function.  Based on likely new meniscal pathology he is rated at 5% permanent partial disability of the right knee and will follow-up as needed.  Okay to return to work regular duty on 12/02/2022 Follow-Up Instructions: No follow-ups on file.   Orders:  No orders of the defined types were placed in this  encounter.  No orders of the defined types were placed in this encounter.     Procedures: No procedures performed   Clinical Data: No additional findings.  Objective: Vital Signs: There were no vitals taken for this visit.  Physical Exam:  Constitutional: Patient appears well-developed HEENT:  Head: Normocephalic Eyes:EOM are normal Neck: Normal range of motion Cardiovascular: Normal rate Pulmonary/chest: Effort normal Neurologic: Patient is alert Skin: Skin is warm Psychiatric: Patient has normal mood and affect  Ortho Exam: Ortho exam demonstrates trace effusion in the right knee.  Does have slight weakness to hamstring strength testing on the right compared to the left.  ACL is stable.  Mild patellofemoral crepitus is present.  Extensor mechanism intact and nontender.  Flexion easily past 90.  Specialty Comments:  No specialty comments available.  Imaging: No results found.   PMFS History: Patient Active Problem List   Diagnosis Date Noted   Traumatic arthritis of right knee 06/26/2022   Primary hypertension 04/22/2022   Obesity (BMI 30-39.9) 04/22/2022   Past Medical History:  Diagnosis Date   Hypertension     Family History  Problem Relation Age of Onset   Cancer Mother        breast   Cancer Sister        breast  Past Surgical History:  Procedure Laterality Date   KNEE SURGERY     Social History   Occupational History   Not on file  Tobacco Use   Smoking status: Former    Packs/day: 1.5    Types: Cigarettes    Quit date: 2003    Years since quitting: 21.4   Smokeless tobacco: Never  Vaping Use   Vaping Use: Never used  Substance and Sexual Activity   Alcohol use: Yes    Comment: socially   Drug use: No   Sexual activity: Yes

## 2022-12-03 ENCOUNTER — Telehealth: Payer: Self-pay

## 2022-12-03 NOTE — Telephone Encounter (Signed)
-----   Message from Bridgett Larsson sent at 12/02/2022 11:29 AM EDT ----- Regarding: SECURE  Office note 11-30-22 f/u on the FCE Eh Sauseda  Can you have Dr. August Saucer, MD please complete his note and sign off. WC is requesting the dictation. Also, needing to know if a f/u visit is needed regarding Mr. Kristoff to see Dr. August Saucer, MD. His work note states patient can go back to work regular duty on Wednesday 12/02/22. Thank you, Morrison Old

## 2022-12-03 NOTE — Telephone Encounter (Signed)
Can you please clarify if patient needed ROV?

## 2022-12-05 ENCOUNTER — Encounter: Payer: Self-pay | Admitting: Orthopedic Surgery

## 2022-12-07 NOTE — Telephone Encounter (Signed)
All done

## 2022-12-08 ENCOUNTER — Other Ambulatory Visit: Payer: Self-pay

## 2022-12-08 DIAGNOSIS — E785 Hyperlipidemia, unspecified: Secondary | ICD-10-CM

## 2022-12-08 DIAGNOSIS — I10 Essential (primary) hypertension: Secondary | ICD-10-CM

## 2022-12-22 ENCOUNTER — Other Ambulatory Visit: Payer: BC Managed Care – PPO

## 2022-12-25 ENCOUNTER — Encounter: Payer: BC Managed Care – PPO | Admitting: Family

## 2022-12-25 NOTE — Progress Notes (Signed)
  This encounter was created in error - please disregard. No show 

## 2023-01-01 ENCOUNTER — Encounter: Payer: BC Managed Care – PPO | Admitting: Family

## 2023-01-01 ENCOUNTER — Encounter: Payer: Self-pay | Admitting: Family

## 2023-01-01 ENCOUNTER — Other Ambulatory Visit: Payer: BC Managed Care – PPO

## 2023-01-03 ENCOUNTER — Encounter (HOSPITAL_COMMUNITY): Payer: Self-pay | Admitting: Family Medicine

## 2023-01-03 ENCOUNTER — Ambulatory Visit (HOSPITAL_COMMUNITY)
Admission: EM | Admit: 2023-01-03 | Discharge: 2023-01-03 | Disposition: A | Discharge status: Home / Self Care | Payer: BC Managed Care – PPO | Attending: Family Medicine | Admitting: Family Medicine

## 2023-01-03 DIAGNOSIS — Z202 Contact with and (suspected) exposure to infections with a predominantly sexual mode of transmission: Secondary | ICD-10-CM | POA: Insufficient documentation

## 2023-01-03 DIAGNOSIS — Z113 Encounter for screening for infections with a predominantly sexual mode of transmission: Secondary | ICD-10-CM | POA: Insufficient documentation

## 2023-01-03 DIAGNOSIS — I10 Essential (primary) hypertension: Secondary | ICD-10-CM | POA: Diagnosis not present

## 2023-01-03 MED ORDER — AMLODIPINE BESYLATE 10 MG PO TABS: 10.0000 mg | ORAL_TABLET | Freq: Every day | ORAL | 0 refills | Status: DC

## 2023-01-03 NOTE — ED Triage Notes (Signed)
Pt states his "old lady" tested positive for BV. Pt would like STI-testing.

## 2023-01-03 NOTE — ED Notes (Signed)
Patient declined blood work. States this was completed 3 months ago.

## 2023-01-03 NOTE — Discharge Instructions (Signed)
It was nice seeing you today. We will contact you soon with your test results. Please follow up with your PCP for BP management. I did refill your Amlodipine. Contact us as needed.

## 2023-01-03 NOTE — ED Provider Notes (Signed)
[facility]    CSN: [id number] Arrival date & time: 01/03/23  1614      History   Chief Complaint Chief Complaint  Patient presents with   Exposure to STD    HPI Jeffrey Walters is a 57 y.o. male.   The history is provided by the patient. No language interpreter was used.  Exposure to STD Chronicity: His girl friend has BV and he want to get tested for STDs. No hx of actual STD exposure. Pertinent negatives include no chest pain, no headaches and no shortness of breath.  Hypertension This is a chronic problem. Pertinent negatives include no chest pain, no headaches and no shortness of breath. Improvement on treatment: He is out of his Amlodipine and needs a refill.    Past Medical History:  Diagnosis Date   Hypertension     Patient Active Problem List   Diagnosis Date Noted   Traumatic arthritis of right knee 06/26/2022   Primary hypertension 04/22/2022   Obesity (BMI 30-39.9) 04/22/2022    Past Surgical History:  Procedure Laterality Date   KNEE SURGERY         Home Medications    Prior to Admission medications   Medication Sig Start Date End Date Taking? Authorizing Provider  amLODipine (NORVASC) 10 MG tablet Take 1 tablet (10 mg total) by mouth daily. 01/03/23  Yes Doreene Eland, MD    Family History Family History  Problem Relation Age of Onset   Cancer Mother        breast   Cancer Sister        breast    Social History Social History   Tobacco Use   Smoking status: Former    Current packs/day: 0.00    Types: Cigarettes    Quit date: 2003    Years since quitting: 21.5   Smokeless tobacco: Never  Vaping Use   Vaping status: Never Used  Substance Use Topics   Alcohol use: Yes    Comment: socially   Drug use: No     Allergies   Lisinopril   Review of Systems Review of Systems  Respiratory:  Negative for shortness of breath.   Cardiovascular:  Negative for chest pain.  Neurological:  Negative for headaches.      Physical Exam Triage Vital Signs ED Triage Vitals [01/03/23 1722]  Encounter Vitals Group     BP (!) 151/97     Systolic BP Percentile      Diastolic BP Percentile      Pulse Rate 86     Resp 16     Temp 98.5 F (36.9 C)     Temp Source Oral     SpO2 95 %     Weight      Height      Head Circumference      Peak Flow      Pain Score      Pain Loc      Pain Education      Exclude from Growth Chart    No data found.  Updated Vital Signs BP (!) 144/95   Pulse 86   Temp 98.5 F (36.9 C) (Oral)   Resp 16   SpO2 95%   Visual Acuity Right Eye Distance:   Left Eye Distance:   Bilateral Distance:    Right Eye Near:   Left Eye Near:    Bilateral Near:     Physical Exam   UC Treatments / Results  Labs (  all labs ordered are listed, but only abnormal results are displayed) Labs Reviewed  HIV ANTIBODY (ROUTINE TESTING W REFLEX)  RPR  CYTOLOGY, (ORAL, ANAL, URETHRAL) ANCILLARY ONLY    EKG   Radiology No results found.  Procedures Procedures (including critical care time)  Medications Ordered in UC Medications - No data to display  Initial Impression / Assessment and Plan / UC Course  I have reviewed the triage vital signs and the nursing notes.  Pertinent labs & imaging results that were available during my care of the patient were reviewed by me and considered in my medical decision making (see chart for details).     STD screening: No actual confirmed STD exposure Urethral self-swab collected for GC/Chlam and Trich HIV and RPR tested I will contact him soon with his results  HTN: Out of his Norvasc 10 mg Med refilled Repeat BP today was 144/95 PCP f/u encouraged  Final Clinical Impressions(s) / UC Diagnoses   Final diagnoses:  STD exposure  Screening examination for STD (sexually transmitted disease)  Essential hypertension     Discharge Instructions      It was nice seeing you today. We will contact you soon with your test  results. Please follow up with your PCP for BP management. I did refill your Amlodipine. Contact us as needed.     ED Prescriptions     Medication Sig Dispense Auth. Provider   amLODipine (NORVASC) 10 MG tablet Take 1 tablet (10 mg total) by mouth daily. 30 tablet Doreene Eland, MD      PDMP not reviewed this encounter.   Doreene Eland, MD 01/03/23 [phone]

## 2023-01-04 ENCOUNTER — Telehealth: Payer: Self-pay | Admitting: Family Medicine

## 2023-01-04 LAB — CYTOLOGY, (ORAL, ANAL, URETHRAL) ANCILLARY ONLY
Chlamydia: NEGATIVE
Comment: NEGATIVE
Comment: NEGATIVE
Comment: NORMAL
Neisseria Gonorrhea: NEGATIVE
Trichomonas: POSITIVE — AB

## 2023-01-04 MED ORDER — METRONIDAZOLE 500 MG PO TABS: 2000.0000 mg | ORAL_TABLET | Freq: Once | ORAL | 0 refills | Status: AC

## 2023-01-04 NOTE — Telephone Encounter (Signed)
HIPAA compliant callback message left. Attempted to reach him twice.  Please advise when he calls that his STD screen is positive for Trichomonas  I have sent in antibiotic to his pharmacy. His partner need to be tested and treated as well, if not already done.

## 2023-01-05 ENCOUNTER — Telehealth (HOSPITAL_COMMUNITY): Payer: Self-pay | Admitting: Emergency Medicine

## 2023-01-05 MED ORDER — METRONIDAZOLE 500 MG PO TABS: 2000.0000 mg | ORAL_TABLET | Freq: Once | ORAL | 0 refills | Status: AC

## 2023-01-22 NOTE — Progress Notes (Signed)
  This encounter was created in error - please disregard. No show 

## 2023-01-29 ENCOUNTER — Other Ambulatory Visit: Payer: Self-pay | Admitting: Family Medicine

## 2024-05-04 NOTE — Telephone Encounter (Signed)
 open in error
